# Patient Record
Sex: Female | Born: 1970 | Race: White | Hispanic: No | Smoking: Never smoker
Health system: Southern US, Community
[De-identification: ages and names within clinical notes are randomized; demographics above are authoritative.]

## PROBLEM LIST (undated history)

## (undated) DIAGNOSIS — J45909 Unspecified asthma, uncomplicated: Secondary | ICD-10-CM

## (undated) DIAGNOSIS — E119 Type 2 diabetes mellitus without complications: Secondary | ICD-10-CM

## (undated) DIAGNOSIS — N200 Calculus of kidney: Secondary | ICD-10-CM

## (undated) DIAGNOSIS — I10 Essential (primary) hypertension: Secondary | ICD-10-CM

## (undated) HISTORY — DX: Essential (primary) hypertension: I10

## (undated) HISTORY — DX: Type 2 diabetes mellitus without complications: E11.9

---

## 1992-04-16 HISTORY — PX: TUBAL LIGATION: SHX77

## 2004-04-13 ENCOUNTER — Ambulatory Visit (HOSPITAL_COMMUNITY): Admission: RE | Admit: 2004-04-13 | Discharge: 2004-04-13 | Payer: Self-pay | Admitting: Obstetrics & Gynecology

## 2004-07-25 ENCOUNTER — Ambulatory Visit: Payer: Self-pay | Admitting: Family Medicine

## 2004-10-25 ENCOUNTER — Ambulatory Visit: Payer: Self-pay | Admitting: Family Medicine

## 2004-12-14 ENCOUNTER — Encounter: Payer: Self-pay | Admitting: Obstetrics & Gynecology

## 2004-12-14 ENCOUNTER — Ambulatory Visit (HOSPITAL_COMMUNITY): Admission: RE | Admit: 2004-12-14 | Discharge: 2004-12-14 | Payer: Self-pay | Admitting: Obstetrics & Gynecology

## 2005-01-18 ENCOUNTER — Emergency Department (HOSPITAL_COMMUNITY): Admission: EM | Admit: 2005-01-18 | Discharge: 2005-01-18 | Payer: Self-pay | Admitting: Emergency Medicine

## 2005-02-09 ENCOUNTER — Emergency Department (HOSPITAL_COMMUNITY): Admission: EM | Admit: 2005-02-09 | Discharge: 2005-02-09 | Payer: Self-pay | Admitting: Emergency Medicine

## 2006-04-16 HISTORY — PX: ENDOMETRIAL ABLATION: SHX621

## 2007-01-08 ENCOUNTER — Emergency Department (HOSPITAL_COMMUNITY): Admission: EM | Admit: 2007-01-08 | Discharge: 2007-01-08 | Payer: Self-pay | Admitting: Emergency Medicine

## 2007-04-17 ENCOUNTER — Encounter: Payer: Self-pay | Admitting: Family Medicine

## 2007-04-29 ENCOUNTER — Emergency Department (HOSPITAL_COMMUNITY): Admission: EM | Admit: 2007-04-29 | Discharge: 2007-04-29 | Payer: Self-pay | Admitting: Emergency Medicine

## 2007-05-05 ENCOUNTER — Ambulatory Visit (HOSPITAL_COMMUNITY): Admission: RE | Admit: 2007-05-05 | Discharge: 2007-05-05 | Payer: Self-pay | Admitting: Family Medicine

## 2007-05-19 ENCOUNTER — Emergency Department (HOSPITAL_COMMUNITY): Admission: EM | Admit: 2007-05-19 | Discharge: 2007-05-19 | Payer: Self-pay | Admitting: Emergency Medicine

## 2007-08-03 ENCOUNTER — Emergency Department (HOSPITAL_COMMUNITY): Admission: EM | Admit: 2007-08-03 | Discharge: 2007-08-03 | Payer: Self-pay | Admitting: Emergency Medicine

## 2007-11-02 ENCOUNTER — Emergency Department (HOSPITAL_COMMUNITY): Admission: EM | Admit: 2007-11-02 | Discharge: 2007-11-02 | Payer: Self-pay | Admitting: Emergency Medicine

## 2009-09-25 ENCOUNTER — Emergency Department (HOSPITAL_COMMUNITY): Admission: EM | Admit: 2009-09-25 | Discharge: 2009-09-25 | Payer: Self-pay | Admitting: Emergency Medicine

## 2010-05-16 NOTE — Letter (Signed)
Summary: rpc chart  rpc chart   Imported By: Curtis Sites 01/06/2010 09:22:37  _____________________________________________________________________  External Attachment:    Type:   Image     Comment:   External Document

## 2010-07-03 LAB — URINALYSIS, ROUTINE W REFLEX MICROSCOPIC
Bilirubin Urine: NEGATIVE
Hgb urine dipstick: NEGATIVE
Protein, ur: NEGATIVE mg/dL
Urobilinogen, UA: 1 mg/dL (ref 0.0–1.0)

## 2010-09-01 NOTE — Op Note (Signed)
NAME:  Jaclyn Ruiz, FRAISER NO.:  [id number]   MEDICAL RECORD NO.:  [id number]          PATIENT TYPE:  AMB   LOCATION:  DAY                           FACILITY:  [facility]   PHYSICIAN:  Lazaro Arms, M.D.   DATE OF BIRTH:  02-Apr-1971   DATE OF PROCEDURE:  04/13/2004  DATE OF DISCHARGE:  04/13/2004                                 OPERATIVE REPORT   PREOPERATIVE DIAGNOSES:  1.  Menometrorrhagia.  2.  Dysmenorrhea.   POSTOPERATIVE DIAGNOSES:  1.  Menometrorrhagia.  2.  Dysmenorrhea.   PROCEDURE:  Hysteroscopy dilatation and curettage, endometrial ablation.   SURGEON:  Lazaro Arms, M.D.   ANESTHESIA:  General laryngeal mask airway.   FINDINGS:  The patient had normal endometrial cavity, had a small  endometrial polyp which was removed.  The rest of the curettings were  normal.   DESCRIPTION OF OPERATION:  The patient was taken to the operating room and  placed in the supine position, where she underwent laryngeal mask airway.  She was then placed in the dorsal lithotomy position and prepped and draped  in the usual sterile fashion.  A Graves speculum was placed, cervix was  grasped.  A paracervical block was placed.  The cervix was grasped serially  using Hanks dilators to allow passage of the hysteroscope.  The above-noted  findings were seen and curettings were performed, including removal of the  polyp.  Then the endometrial ablation was performed with a HydroThermAblator  unit, which went quite well.  There was a 10-minute heating cycle up to  basically 90 degrees Celsius.  There was no loss of fluid during the  procedure, and the procedure was visualized the entire time.  The patient  tolerated the procedure well.  She experienced minimal blood loss and was  taken to recovery in good, stable condition.  All counts correct x3.      LHE/MEDQ  D:  06/14/2004  T:  06/14/2004  Job:  [id number]

## 2010-09-01 NOTE — Op Note (Signed)
NAME:  Jaclyn Ruiz, Jaclyn Ruiz NO.:  [id number]   MEDICAL RECORD NO.:  [id number]          PATIENT TYPE:  AMB   LOCATION:  DAY                           FACILITY:  [facility]   PHYSICIAN:  Lazaro Arms, M.D.   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   POSTOPERATIVE DIAGNOSES:  1.  Persistent dysfunctional uterine bleeding after HydroThermAblator last      December.  2.  Endometrial polyp.   PROCEDURE:  Hysteroscopy D&C with removal of endometrial polyp and a repeat  endometrial ablation using ThermaChoice III balloon.   SURGEON:  Dr. Despina Hidden.   ANESTHESIA:  General endotracheal.   FINDINGS:  Even though on ultrasound in the office there was no polyp seen  but just a fluid-filled endometrial cavity, at the time of diagnostic  hysteroscopy, she had an endometrial polyp which was adherent, attached to  the pedicle on the posterior wall. It was relatively flat. I think it is  probably hollow because it came out very easily with curettage. The rest of  the endometrium was normal.   DESCRIPTION OF PROCEDURE:  The patient was taken to the operating room,  placed in a supine position where she underwent general endotracheal  anesthesia, placed in dorsal lithotomy position, prepped and draped in the  usual sterile fashion. Grave's speculum was placed. Cervix was grasped. Half  percent Marcaine with 1:200,000 epinephrine was injected as a local  anesthetic without difficulty. A total of 20 cc was used. The cervix was  dilated serially to allow passage of the hysteroscope. Hysteroscopy was  placed, and the above noted findings were seen. A vigorous uterine curettage  was performed, and Randal stone forceps were used to retrieve the polyp.  After the curettage and polyp removal, the  endometrium was completely clear.  A ThermaChoice III endometrial ablation was then performed, 22 cc of D5W was  used to maintain a pressure between 190 and 200 mmHg. It was heated to 87  degrees Celsius for a total therapy time of 9 minutes and 58 seconds. All  the fluid was retrieved at the end of the procedure. The patient tolerated  the procedure well. She experienced minimal blood loss and was taken to  recovery room in good and stable condition. She received Ancef and Toradol  prophylactically.      Lazaro Arms, M.D.  Electronically Signed     LHE/MEDQ  D:  12/14/2004  T:  12/14/2004  Job:  [id number]

## 2012-04-28 ENCOUNTER — Encounter (HOSPITAL_COMMUNITY): Payer: Self-pay | Admitting: *Deleted

## 2012-04-28 ENCOUNTER — Emergency Department (HOSPITAL_COMMUNITY)
Admission: EM | Admit: 2012-04-28 | Discharge: 2012-04-28 | Disposition: A | Discharge status: Home / Self Care | Payer: Self-pay | Attending: Emergency Medicine | Admitting: Emergency Medicine

## 2012-04-28 DIAGNOSIS — J40 Bronchitis, not specified as acute or chronic: Secondary | ICD-10-CM | POA: Insufficient documentation

## 2012-04-28 DIAGNOSIS — J45909 Unspecified asthma, uncomplicated: Secondary | ICD-10-CM | POA: Insufficient documentation

## 2012-04-28 HISTORY — DX: Unspecified asthma, uncomplicated: J45.909

## 2012-04-28 MED ORDER — AMOXICILLIN 500 MG PO CAPS
500.0000 mg | ORAL_CAPSULE | Freq: Once | ORAL | Status: AC
  Administered 2012-04-28: 500 mg via ORAL
  Filled 2012-04-28: qty 1

## 2012-04-28 MED ORDER — AMOXICILLIN 500 MG PO CAPS: 500.0000 mg | ORAL_CAPSULE | Freq: Three times a day (TID) | ORAL | Status: DC

## 2012-04-28 MED ORDER — HYDROCODONE-ACETAMINOPHEN 5-325 MG PO TABS
2.0000 | ORAL_TABLET | Freq: Once | ORAL | Status: AC
  Administered 2012-04-28: 2 via ORAL
  Filled 2012-04-28: qty 2

## 2012-04-28 MED ORDER — HYDROCODONE-ACETAMINOPHEN 5-325 MG PO TABS: 2.0000 | ORAL_TABLET | ORAL | Status: DC | PRN

## 2012-04-28 NOTE — ED Provider Notes (Addendum)
History   This chart was scribed for Doug Sou, MD by Gerlean Ren, ED Scribe. This patient was seen in room TR05C/TR05C and the patient's care was started at 5:31 PM    CSN: [id number]  Arrival date & time 04/28/12  1437   First MD Initiated Contact with Patient 04/28/12 1729      Chief Complaint  Patient presents with  . Otalgia    The history is provided by the patient. No language interpreter was used.   Jaclyn Ruiz is a 42 y.o. female with h/o asthma who presents to the Emergency Department complaining of 2 weeks of constant, non-changing, moderate left side otalgia with associated constant cough producing green phlegm.  Pt denies fever, sore throat.  No OCM used for pain.  No known allergies.  Pt denies tobacco and alcohol use. Patient reports her last asthma attack was age 26 Past Medical History  Diagnosis Date  . Asthma     Past Surgical History  Procedure Date  . Cesarean section     times 2    No family history on file.  History  Substance Use Topics  . Smoking status: Never Smoker   . Smokeless tobacco: Not on file  . Alcohol Use: No    No OB history provided.   Review of Systems  Constitutional: Negative.   HENT: Negative.   Respiratory: Negative.   Cardiovascular: Negative.   Gastrointestinal: Negative.   Musculoskeletal: Negative.   Skin: Negative.   Neurological: Negative.   Hematological: Negative.   Psychiatric/Behavioral: Negative.     Allergies  Review of patient's allergies indicates no known allergies.  Home Medications  No current outpatient prescriptions on file.  BP 138/78  Pulse 86  Temp 98.8 F (37.1 C) (Oral)  Resp 18  SpO2 95%  Physical Exam  Nursing note and vitals reviewed. Constitutional: She appears well-developed and well-nourished.  HENT:  Head: Normocephalic and atraumatic.  Right Ear: External ear normal.  Left Ear: External ear normal.       Red pharynx. Bilateral tympanic membranes normal uvula midline  no tonsillar exudate tonsils not large  Eyes: Conjunctivae normal are normal. Pupils are equal, round, and reactive to light.  Neck: Neck supple. No tracheal deviation present. No thyromegaly present.  Cardiovascular: Normal rate and regular rhythm.   No murmur heard. Pulmonary/Chest: Effort normal and breath sounds normal.       Coughing  Abdominal: Soft. Bowel sounds are normal. She exhibits no distension. There is no tenderness.  Musculoskeletal: Normal range of motion. She exhibits no edema and no tenderness.  Lymphadenopathy:    She has no cervical adenopathy.  Neurological: She is alert. Coordination normal.  Skin: Skin is warm and dry. No rash noted.  Psychiatric: She has a normal mood and affect.   Oral ED Course  Procedures (including critical care time) DIAGNOSTIC STUDIES: Oxygen Saturation is 95% on room air, adequate by my interpretation.    COORDINATION OF CARE: 5:33 PM- Patient informed of clinical course, understands medical decision-making process, and agrees with plan.  No diagnosis found.    MDM  Suspect viral illness however patient has been with symptoms for 2.5 weeks therefore prescribed antibiotic, Amoxil. Also Norco as needed for pain. Referral resource guide Diagnosis #1 bronchitis  #2 otalgia      I personally performed the services described in this documentation, which was scribed in my presence. The recorded information has been reviewed and is accurate.  I personally performed the services described  in this documentation, which was scribed in my presence. The recorded information has been reviewed and is accurate.   Doug Sou, MD 04/28/12 1739  Doug Sou, MD 04/29/12 4098

## 2012-04-28 NOTE — ED Notes (Signed)
Left earache onset last Weds, no hx of ear infection/pain. A&Ox4, ambulatory, nad.

## 2012-04-28 NOTE — ED Notes (Signed)
Pt ambulatory leaving ED by self. Pt has been instructed not to drive. Pt states husband is driving her home. Pt given d/c teaching and prescriptions. Pt verbalized understanding of instructions and has no further questions upon d/c. Pt does not appear to be in acute distress upon d/c.

## 2012-04-28 NOTE — ED Notes (Signed)
Pt is here with left ear ache and persistent cough for 2 weeks.  No fever

## 2014-06-26 ENCOUNTER — Emergency Department (HOSPITAL_COMMUNITY)
Admission: EM | Admit: 2014-06-26 | Discharge: 2014-06-26 | Disposition: A | Discharge status: Home / Self Care | Payer: Self-pay | Attending: Emergency Medicine | Admitting: Emergency Medicine

## 2014-06-26 ENCOUNTER — Emergency Department (HOSPITAL_COMMUNITY): Payer: Self-pay

## 2014-06-26 ENCOUNTER — Encounter (HOSPITAL_COMMUNITY): Payer: Self-pay | Admitting: *Deleted

## 2014-06-26 DIAGNOSIS — J45909 Unspecified asthma, uncomplicated: Secondary | ICD-10-CM | POA: Insufficient documentation

## 2014-06-26 DIAGNOSIS — R1032 Left lower quadrant pain: Secondary | ICD-10-CM

## 2014-06-26 DIAGNOSIS — N201 Calculus of ureter: Secondary | ICD-10-CM | POA: Insufficient documentation

## 2014-06-26 DIAGNOSIS — Z3202 Encounter for pregnancy test, result negative: Secondary | ICD-10-CM | POA: Insufficient documentation

## 2014-06-26 DIAGNOSIS — R109 Unspecified abdominal pain: Secondary | ICD-10-CM

## 2014-06-26 LAB — CBC
HCT: 39.5 % (ref 36.0–46.0)
Hemoglobin: 12.8 g/dL (ref 12.0–15.0)
MCH: 27.6 pg (ref 26.0–34.0)
MCHC: 32.4 g/dL (ref 30.0–36.0)
MCV: 85.1 fL (ref 78.0–100.0)
PLATELETS: 311 10*3/uL (ref 150–400)
RBC: 4.64 MIL/uL (ref 3.87–5.11)
RDW: 14.1 % (ref 11.5–15.5)
WBC: 13.4 10*3/uL — AB (ref 4.0–10.5)

## 2014-06-26 LAB — COMPREHENSIVE METABOLIC PANEL
ALBUMIN: 3.3 g/dL — AB (ref 3.5–5.2)
ALK PHOS: 56 U/L (ref 39–117)
ALT: 18 U/L (ref 0–35)
AST: 15 U/L (ref 0–37)
Anion gap: 7 (ref 5–15)
BILIRUBIN TOTAL: 0.5 mg/dL (ref 0.3–1.2)
BUN: 18 mg/dL (ref 6–23)
CALCIUM: 9.3 mg/dL (ref 8.4–10.5)
CO2: 28 mmol/L (ref 19–32)
Chloride: 103 mmol/L (ref 96–112)
Creatinine, Ser: 0.86 mg/dL (ref 0.50–1.10)
GFR calc Af Amer: >90 mL/min (ref >90)
GFR, EST NON AFRICAN AMERICAN: 80 mL/min — AB (ref >90)
Glucose, Bld: 108 mg/dL — ABNORMAL HIGH (ref 70–99)
Potassium: 3.5 mmol/L (ref 3.5–5.1)
Sodium: 138 mmol/L (ref 135–145)
TOTAL PROTEIN: 7.7 g/dL (ref 6.0–8.3)

## 2014-06-26 LAB — URINALYSIS, ROUTINE W REFLEX MICROSCOPIC
BILIRUBIN URINE: NEGATIVE
Glucose, UA: NEGATIVE mg/dL
KETONES UR: NEGATIVE mg/dL
Leukocytes, UA: NEGATIVE
NITRITE: NEGATIVE
PH: 6 (ref 5.0–8.0)
Protein, ur: NEGATIVE mg/dL
SPECIFIC GRAVITY, URINE: 1.02 (ref 1.005–1.030)
UROBILINOGEN UA: 0.2 mg/dL (ref 0.0–1.0)

## 2014-06-26 LAB — URINE MICROSCOPIC-ADD ON

## 2014-06-26 LAB — PREGNANCY, URINE: Preg Test, Ur: NEGATIVE

## 2014-06-26 MED ORDER — TAMSULOSIN HCL 0.4 MG PO CAPS: ORAL_CAPSULE | ORAL | Status: DC

## 2014-06-26 MED ORDER — SODIUM CHLORIDE 0.9 % IV SOLN
INTRAVENOUS | Status: DC
  Administered 2014-06-26: 04:00:00 via INTRAVENOUS

## 2014-06-26 MED ORDER — FENTANYL CITRATE 0.05 MG/ML IJ SOLN
50.0000 ug | Freq: Once | INTRAMUSCULAR | Status: AC
  Administered 2014-06-26: 50 ug via INTRAVENOUS
  Filled 2014-06-26: qty 2

## 2014-06-26 MED ORDER — ONDANSETRON HCL 4 MG/2ML IJ SOLN
4.0000 mg | Freq: Once | INTRAMUSCULAR | Status: AC
  Administered 2014-06-26: 4 mg via INTRAVENOUS
  Filled 2014-06-26: qty 2

## 2014-06-26 MED ORDER — OXYCODONE-ACETAMINOPHEN 5-325 MG PO TABS: 1.0000 | ORAL_TABLET | Freq: Four times a day (QID) | ORAL | Status: DC | PRN

## 2014-06-26 MED ORDER — ONDANSETRON 4 MG PO TBDP: 4.0000 mg | ORAL_TABLET | Freq: Three times a day (TID) | ORAL | Status: DC | PRN

## 2014-06-26 NOTE — ED Notes (Signed)
Pt c/o left sided flank pain and back x 1 wk. Pt states she is also nauseated. Pt states the pain and vomiting eased up last week and now the pain is back.

## 2014-06-26 NOTE — ED Provider Notes (Signed)
CSN: [id number]     Arrival date & time 06/26/14  0009 History   First MD Initiated Contact with Patient 06/26/14 0249     Chief Complaint  Patient presents with  . Flank Pain     (Consider location/radiation/quality/duration/timing/severity/associated sxs/prior Treatment) HPI  Patient relates last week she started having left flank pain that radiated into her left stomach that lasted all day and all night. It then went away however it did return a few days ago with some left lower quadrant pain. She reports tonight she had acute worsening of the pain with pain in her left flank radiating into her left lower quadrant. She denies nausea, vomiting, hematuria, pain on urination although she has had frequency and urge to urinate. She denies any fever. She states nothing she does makes the pain hurt more, nothing she does makes her feel better. She states she's never had this pain before. She does report drinking a lot of sodas but mainly Sprite which are caffeine free.  Family history her daughter has had kidney stones  PCP none  Past Medical History  Diagnosis Date  . Asthma    Past Surgical History  Procedure Laterality Date  . Cesarean section      times 2   History reviewed. No pertinent family history. History  Substance Use Topics  . Smoking status: Never Smoker   . Smokeless tobacco: Not on file  . Alcohol Use: No   employed  OB History    No data available     Review of Systems  All other systems reviewed and are negative.     Allergies  Review of patient's allergies indicates no known allergies.  Home Medications   Prior to Admission medications   Medication Sig Start Date End Date Taking? Authorizing Provider  ondansetron (ZOFRAN ODT) 4 MG disintegrating tablet Take 1 tablet (4 mg total) by mouth every 8 (eight) hours as needed for nausea or vomiting. 06/26/14   Devoria AlbeIva Jazzelle Zhang, MD  oxyCODONE-acetaminophen (PERCOCET/ROXICET) 5-325 MG per tablet Take 1-2 tablets by  mouth every 6 (six) hours as needed for severe pain. 06/26/14   Devoria AlbeIva Liora Myles, MD  tamsulosin (FLOMAX) 0.4 MG CAPS capsule Take 1 po QD until you pass the stone. 06/26/14   Devoria AlbeIva Blayden Conwell, MD   BP 118/74 mmHg  Pulse 74  Temp(Src) 97.8 F (36.6 C) (Oral)  Resp 18  Ht 5' 2.5" (1.588 m)  Wt 283 lb (128.368 kg)  BMI 50.90 kg/m2  SpO2 99%  Vital signs normal   Physical Exam  Constitutional: She is oriented to person, place, and time. She appears well-developed and well-nourished.  Non-toxic appearance. She does not appear ill. No distress.  HENT:  Head: Normocephalic and atraumatic.  Right Ear: External ear normal.  Left Ear: External ear normal.  Nose: Nose normal. No mucosal edema or rhinorrhea.  Mouth/Throat: Oropharynx is clear and moist and mucous membranes are normal. No dental abscesses or uvula swelling.  Eyes: Conjunctivae and EOM are normal. Pupils are equal, round, and reactive to light.  Neck: Normal range of motion and full passive range of motion without pain. Neck supple.  Cardiovascular: Normal rate, regular rhythm and normal heart sounds.  Exam reveals no gallop and no friction rub.   No murmur heard. Pulmonary/Chest: Effort normal and breath sounds normal. No respiratory distress. She has no wheezes. She has no rhonchi. She has no rales. She exhibits no tenderness and no crepitus.  Abdominal: Soft. Normal appearance and bowel sounds are normal.  She exhibits no distension. There is tenderness. There is no rebound and no guarding.    Patient has some mild left flank tenderness  Musculoskeletal: Normal range of motion. She exhibits no edema or tenderness.  Moves all extremities well.   Neurological: She is alert and oriented to person, place, and time. She has normal strength. No cranial nerve deficit.  Skin: Skin is warm, dry and intact. No rash noted. No erythema. No pallor.  Psychiatric: She has a normal mood and affect. Her speech is normal and behavior is normal. Her mood  appears not anxious.  Nursing note and vitals reviewed.   ED Course  Procedures (including critical care time)  Medications  0.9 %  sodium chloride infusion ( Intravenous New Bag/Given 06/26/14 0402)  fentaNYL (SUBLIMAZE) injection 50 mcg (50 mcg Intravenous Given 06/26/14 0403)  ondansetron (ZOFRAN) injection 4 mg (4 mg Intravenous Given 06/26/14 0402)   Patient was rechecked couple of times. She remained pain-free after getting that pain medication in the ED. We discussed her CT results. We discussed of her pain gets worse that she might need a stent and possible lithotripsy. She understands she needs to follow-up with urology and if she gets worse over the weekend to go to [facility] emergency department.   Labs Review Results for orders placed or performed during the hospital encounter of 06/26/14  Pregnancy, urine  Result Value Ref Range   Preg Test, Ur NEGATIVE NEGATIVE  Urinalysis, Routine w reflex microscopic  Result Value Ref Range   Color, Urine YELLOW YELLOW   APPearance HAZY (A) CLEAR   Specific Gravity, Urine 1.020 1.005 - 1.030   pH 6.0 5.0 - 8.0   Glucose, UA NEGATIVE NEGATIVE mg/dL   Hgb urine dipstick LARGE (A) NEGATIVE   Bilirubin Urine NEGATIVE NEGATIVE   Ketones, ur NEGATIVE NEGATIVE mg/dL   Protein, ur NEGATIVE NEGATIVE mg/dL   Urobilinogen, UA 0.2 0.0 - 1.0 mg/dL   Nitrite NEGATIVE NEGATIVE   Leukocytes, UA NEGATIVE NEGATIVE  Urine microscopic-add on  Result Value Ref Range   Squamous Epithelial / LPF FEW (A) RARE   WBC, UA 0-2 <3 WBC/hpf   RBC / HPF TOO NUMEROUS TO COUNT <3 RBC/hpf   Bacteria, UA FEW (A) RARE  CBC  Result Value Ref Range   WBC 13.4 (H) 4.0 - 10.5 K/uL   RBC 4.64 3.87 - 5.11 MIL/uL   Hemoglobin 12.8 12.0 - 15.0 g/dL   HCT 40.9 81.1 - 91.4 %   MCV 85.1 78.0 - 100.0 fL   MCH 27.6 26.0 - 34.0 pg   MCHC 32.4 30.0 - 36.0 g/dL   RDW 78.2 95.6 - 21.3 %   Platelets 311 150 - 400 K/uL  Comprehensive metabolic panel  Result Value Ref  Range   Sodium 138 135 - 145 mmol/L   Potassium 3.5 3.5 - 5.1 mmol/L   Chloride 103 96 - 112 mmol/L   CO2 28 19 - 32 mmol/L   Glucose, Bld 108 (H) 70 - 99 mg/dL   BUN 18 6 - 23 mg/dL   Creatinine, Ser 0.86 0.50 - 1.10 mg/dL   Calcium 9.3 8.4 - 57.8 mg/dL   Total Protein 7.7 6.0 - 8.3 g/dL   Albumin 3.3 (L) 3.5 - 5.2 g/dL   AST 15 0 - 37 U/L   ALT 18 0 - 35 U/L   Alkaline Phosphatase 56 39 - 117 U/L   Total Bilirubin 0.5 0.3 - 1.2 mg/dL   GFR calc non  Af Amer 80 (L) >90 mL/min   GFR calc Af Amer >90 >90 mL/min   Anion gap 7 5 - 15   Laboratory interpretation all normal except hematuria     Imaging Review Ct Renal Stone Study  06/26/2014   CLINICAL DATA:  Acute left lower quadrant and left flank pain. Nausea and vomiting  EXAM: CT ABDOMEN AND PELVIS WITHOUT CONTRAST  TECHNIQUE: Multidetector CT imaging of the abdomen and pelvis was performed following the standard protocol without IV contrast.  COMPARISON:  04/29/2007  FINDINGS: The included lung bases are clear.  There is a 6 mm obstructing stone in the left proximal ureter with resultant hydronephrosis. Mild left perinephric stranding. Ureter distal to this is decompressed. Question additional punctate nonobstructing left renal calculi. No stones or obstructive uropathy in the right kidney.  Mild hepatic steatosis. Gallbladder is decompressed. The unenhanced spleen, pancreas, and adrenal gland are normal. Stomach is physiologically distended with ingested contents. There are no dilated or thickened bowel loops. Small volume of stool throughout the colon. The appendix is normal. No free air, free fluid, or intra-abdominal fluid collection.  Abdominal aorta is normal in caliber. No retroperitoneal adenopathy.  In the pelvis the bladder is minimally distended. The uterus and adnexa are normal. No pelvic free fluid.  Facet arthropathy in the lower lumbar spine. Punctate bone islands in the pelvis.  IMPRESSION: 1. Obstructing 6 mm stone in the  left proximal ureter with resultant hydronephrosis. Probable additional punctate nonobstructing left renal calculi. 2. Hepatic steatosis.   Electronically Signed   By: Rubye Oaks M.D.   On: 06/26/2014 05:30     EKG Interpretation None      MDM   Final diagnoses:  Acute left flank pain  LLQ abdominal pain  Left ureteral stone    New Prescriptions   ONDANSETRON (ZOFRAN ODT) 4 MG DISINTEGRATING TABLET    Take 1 tablet (4 mg total) by mouth every 8 (eight) hours as needed for nausea or vomiting.   OXYCODONE-ACETAMINOPHEN (PERCOCET/ROXICET) 5-325 MG PER TABLET    Take 1-2 tablets by mouth every 6 (six) hours as needed for severe pain.   TAMSULOSIN (FLOMAX) 0.4 MG CAPS CAPSULE    Take 1 po QD until you pass the stone.    Plan discharge  Devoria Albe, MD, Concha Pyo, MD 06/26/14 [phone]

## 2014-06-26 NOTE — Discharge Instructions (Signed)
Drink plenty of fluids. Take the medications as prescribed. If he should get fever, or have uncontrolled vomiting or worsening [facility]rolog[facility]. Otherwise call the office on Monday to get a follow-up appointment this week. Some of his partners to come to [place] so you could see one of them in the [place] office.   Urine Strainer This strainer is used to catch or filter out any stones found in your urine. Place the strainer under your urine stream. Save any stones or objects that you find in your urine. Place them in a plastic or glass container to show your caregiver. The stones vary in size - some can be very small, so make sure you check the strainer carefully. Your caregiver may send the stone to the lab. When the results are back, your caregiver may recommend medicines or diet changes.  Document Released: 01/06/2004 Document Revised: 06/25/2011 Document Reviewed: 02/13/2008 [facility] Patient Information 2015 [place]. This information is not intended to replace advice given to you by your health care provider. Make sure you discuss any questions you have with your health care provider.  Ureteral Colic (Kidney Stones) Ureteral colic is the result of a condition when kidney stones form inside the kidney. Once kidney stones are formed they may move into the tube that connects the kidney with the bladder (ureter). If this occurs, this condition may cause pain (colic) in the ureter.  CAUSES  Pain is caused by stone movement in the ureter and the obstruction caused by the stone. SYMPTOMS  The pain comes and goes as the ureter contracts around the stone. The pain is usually intense, sharp, and stabbing in character. The location of the pain may move as the stone moves through the ureter. When the stone is near the kidney the pain is usually located in the back and radiates to the belly (abdomen). When the stone  is ready to pass into the bladder the pain is often located in the lower abdomen on the side the stone is located. At this location, the symptoms may mimic those of a urinary tract infection with urinary frequency. Once the stone is located here it often passes into the bladder and the pain disappears completely. TREATMENT   Your caregiver will provide you with medicine for pain relief.  You may require specialized follow-up X-rays.  The absence of pain does not always mean that the stone has passed. It may have just stopped moving. If the urine remains completely obstructed, it can cause loss of kidney function or even complete destruction of the involved kidney. It is your responsibility and in your interest that X-rays and follow-ups as suggested by your caregiver are completed. Relief of pain without passage of the stone can be associated with severe damage to the kidney, including loss of kidney function on that side.  If your stone does not pass on its own, additional measures may be taken by your caregiver to ensure its removal. HOME CARE INSTRUCTIONS   Increase your fluid intake. Water is the preferred fluid since juices containing vitamin C may acidify the urine making it less likely for certain stones (uric acid stones) to pass.  Strain all urine. A strainer will be provided. Keep all particulate matter or stones for your caregiver to inspect.  Take your pain medicine as directed.  Make a follow-up appointment with your caregiver as directed.  Remember that the goal is passage of  your stone. The absence of pain does not mean the stone is gone. Follow your caregiver's instructions.  Only take over-the-counter or prescription medicines for pain, discomfort, or fever as directed by your caregiver. SEEK MEDICAL CARE IF:   Pain cannot be controlled with the prescribed medicine.  You have a fever.  Pain continues for longer than your caregiver advises it should.  There is a change  in the pain, and you develop chest discomfort or constant abdominal pain.  You feel faint or pass out. MAKE SURE YOU:   Understand these instructions.  Will watch your condition.  Will get help right away if you are not doing well or get worse. Document Released: 01/10/2005 Document Revised: 07/28/2012 Document Reviewed: 09/27/2010 [facility] Patient Information 2015 [place], [place]. This information is not intended to replace advice given to you by your health care provider. Make sure you discuss any questions you have with your health care provider.

## 2014-07-08 ENCOUNTER — Encounter (HOSPITAL_COMMUNITY): Payer: Self-pay | Admitting: Emergency Medicine

## 2014-07-08 ENCOUNTER — Emergency Department (HOSPITAL_COMMUNITY)
Admission: EM | Admit: 2014-07-08 | Discharge: 2014-07-08 | Disposition: A | Discharge status: Home / Self Care | Payer: Self-pay | Attending: Emergency Medicine | Admitting: Emergency Medicine

## 2014-07-08 DIAGNOSIS — N200 Calculus of kidney: Secondary | ICD-10-CM | POA: Insufficient documentation

## 2014-07-08 DIAGNOSIS — J45909 Unspecified asthma, uncomplicated: Secondary | ICD-10-CM | POA: Insufficient documentation

## 2014-07-08 DIAGNOSIS — Z9889 Other specified postprocedural states: Secondary | ICD-10-CM | POA: Insufficient documentation

## 2014-07-08 DIAGNOSIS — Z3202 Encounter for pregnancy test, result negative: Secondary | ICD-10-CM | POA: Insufficient documentation

## 2014-07-08 DIAGNOSIS — E669 Obesity, unspecified: Secondary | ICD-10-CM | POA: Insufficient documentation

## 2014-07-08 LAB — URINE MICROSCOPIC-ADD ON

## 2014-07-08 LAB — URINALYSIS, ROUTINE W REFLEX MICROSCOPIC
Bilirubin Urine: NEGATIVE
Glucose, UA: NEGATIVE mg/dL
KETONES UR: NEGATIVE mg/dL
Leukocytes, UA: NEGATIVE
NITRITE: NEGATIVE
PH: 5.5 (ref 5.0–8.0)
Protein, ur: NEGATIVE mg/dL
SPECIFIC GRAVITY, URINE: 1.023 (ref 1.005–1.030)
Urobilinogen, UA: 0.2 mg/dL (ref 0.0–1.0)

## 2014-07-08 LAB — LIPASE, BLOOD: Lipase: 28 U/L (ref 11–59)

## 2014-07-08 LAB — CBC WITH DIFFERENTIAL/PLATELET
BASOS ABS: 0.1 10*3/uL (ref 0.0–0.1)
Basophils Relative: 1 % (ref 0–1)
EOS PCT: 6 % — AB (ref 0–5)
Eosinophils Absolute: 0.8 10*3/uL — ABNORMAL HIGH (ref 0.0–0.7)
HEMATOCRIT: 41.2 % (ref 36.0–46.0)
Hemoglobin: 13.2 g/dL (ref 12.0–15.0)
LYMPHS ABS: 2.8 10*3/uL (ref 0.7–4.0)
LYMPHS PCT: 23 % (ref 12–46)
MCH: 27.4 pg (ref 26.0–34.0)
MCHC: 32 g/dL (ref 30.0–36.0)
MCV: 85.7 fL (ref 78.0–100.0)
MONO ABS: 1.1 10*3/uL — AB (ref 0.1–1.0)
Monocytes Relative: 9 % (ref 3–12)
NEUTROS ABS: 7.3 10*3/uL (ref 1.7–7.7)
Neutrophils Relative %: 61 % (ref 43–77)
Platelets: 337 10*3/uL (ref 150–400)
RBC: 4.81 MIL/uL (ref 3.87–5.11)
RDW: 14 % (ref 11.5–15.5)
WBC: 12.1 10*3/uL — AB (ref 4.0–10.5)

## 2014-07-08 LAB — COMPREHENSIVE METABOLIC PANEL
ALBUMIN: 3.5 g/dL (ref 3.5–5.2)
ALT: 23 U/L (ref 0–35)
AST: 24 U/L (ref 0–37)
Alkaline Phosphatase: 60 U/L (ref 39–117)
Anion gap: 9 (ref 5–15)
BUN: 15 mg/dL (ref 6–23)
CALCIUM: 9.1 mg/dL (ref 8.4–10.5)
CO2: 26 mmol/L (ref 19–32)
Chloride: 102 mmol/L (ref 96–112)
Creatinine, Ser: 1.08 mg/dL (ref 0.50–1.10)
GFR calc Af Amer: 71 mL/min — ABNORMAL LOW (ref >90)
GFR, EST NON AFRICAN AMERICAN: 61 mL/min — AB (ref >90)
Glucose, Bld: 124 mg/dL — ABNORMAL HIGH (ref 70–99)
Potassium: 4.4 mmol/L (ref 3.5–5.1)
SODIUM: 137 mmol/L (ref 135–145)
TOTAL PROTEIN: 8.1 g/dL (ref 6.0–8.3)
Total Bilirubin: 0.7 mg/dL (ref 0.3–1.2)

## 2014-07-08 LAB — PREGNANCY, URINE: PREG TEST UR: NEGATIVE

## 2014-07-08 MED ORDER — KETOROLAC TROMETHAMINE 30 MG/ML IJ SOLN
30.0000 mg | Freq: Once | INTRAMUSCULAR | Status: AC
  Administered 2014-07-08: 30 mg via INTRAVENOUS
  Filled 2014-07-08: qty 1

## 2014-07-08 MED ORDER — ONDANSETRON HCL 4 MG/2ML IJ SOLN
4.0000 mg | Freq: Once | INTRAMUSCULAR | Status: AC
  Administered 2014-07-08: 4 mg via INTRAVENOUS
  Filled 2014-07-08: qty 2

## 2014-07-08 MED ORDER — HYDROMORPHONE HCL 1 MG/ML IJ SOLN
1.0000 mg | Freq: Once | INTRAMUSCULAR | Status: AC
  Administered 2014-07-08: 1 mg via INTRAVENOUS
  Filled 2014-07-08: qty 1

## 2014-07-08 MED ORDER — MORPHINE SULFATE 4 MG/ML IJ SOLN
4.0000 mg | Freq: Once | INTRAMUSCULAR | Status: AC
  Administered 2014-07-08: 4 mg via INTRAVENOUS
  Filled 2014-07-08: qty 1

## 2014-07-08 MED ORDER — HYDROCODONE-ACETAMINOPHEN 5-325 MG PO TABS: 1.0000 | ORAL_TABLET | ORAL | Status: DC | PRN

## 2014-07-08 NOTE — ED Provider Notes (Signed)
CSN: [id number]     Arrival date & time 07/08/14  1026 History   First MD Initiated Contact with Patient 07/08/14 1219     Chief Complaint  Patient presents with  . Abdominal Pain  . Back Pain   Jaclyn Ruiz is a 44 y.o. female with a history of kidney stones who presents to the emergency department complaining of left flank pain beginning around 3 hours prior to arrival. Patient rates her pain at 9 out of 10 and describes it as sharp and constant. Patient's pain is worse standing but she is unable to identify any other alleviating or aggravating factors. Patient was diagnosed with kidney stone on 06/25/2014 Jaclyn Ruiz and reports her pain resolved after being in the emergency department. She reports today that her pain has returned and this feels the same as her previous kidney stone.  The patient is not following up with a urologist. Patient reports taking  Hydrocodone earlier today with minimal relief. The patient denies fevers, chills, dysuria, difficulty urinating, hematuria, urinary frequency, nausea, vomiting, diarrhea, constipation, vaginal bleeding or discharge.   (Consider location/radiation/quality/duration/timing/severity/associated sxs/prior Treatment) HPI  Past Medical History  Diagnosis Date  . Asthma    Past Surgical History  Procedure Laterality Date  . Cesarean section      times 2   No family history on file. History  Substance Use Topics  . Smoking status: Never Smoker   . Smokeless tobacco: Not on file  . Alcohol Use: No   OB History    No data available     Review of Systems  Constitutional: Negative for fever and chills.  HENT: Negative for congestion, ear pain and sore throat.   Eyes: Negative for pain and visual disturbance.  Respiratory: Negative for cough, shortness of breath and wheezing.   Cardiovascular: Negative for chest pain and palpitations.  Gastrointestinal: Positive for abdominal pain. Negative for nausea, vomiting and diarrhea.   Genitourinary: Positive for flank pain. Negative for dysuria, frequency, hematuria, vaginal bleeding, vaginal discharge, difficulty urinating and menstrual problem.  Musculoskeletal: Negative for back pain and neck pain.  Skin: Negative for rash.  Neurological: Negative for light-headedness and headaches.      Allergies  Review of patient's allergies indicates no known allergies.  Home Medications   Prior to Admission medications   Medication Sig Start Date End Date Taking? Authorizing Provider  oxyCODONE-acetaminophen (PERCOCET/ROXICET) 5-325 MG per tablet Take 1-2 tablets by mouth every 6 (six) hours as needed for severe pain. 06/26/14  Yes Jaclyn Albe, MD  HYDROcodone-acetaminophen (NORCO/VICODIN) 5-325 MG per tablet Take 1-2 tablets by mouth every 4 (four) hours as needed. 07/08/14   Jaclyn Farrier, PA-C  ondansetron (ZOFRAN ODT) 4 MG disintegrating tablet Take 1 tablet (4 mg total) by mouth every 8 (eight) hours as needed for nausea or vomiting. 06/26/14   Jaclyn Albe, MD  tamsulosin (FLOMAX) 0.4 MG CAPS capsule Take 1 po QD until you pass the stone. 06/26/14   Jaclyn Albe, MD   BP 161/94 mmHg  Pulse 62  Temp(Src) 97.8 F (36.6 C) (Oral)  Resp 16  SpO2 99% Physical Exam  Constitutional: She appears well-developed and well-nourished. No distress.   Obese female. Nontoxic appearing.  HENT:  Head: Normocephalic and atraumatic.  Mouth/Throat: Oropharynx is clear and moist. No oropharyngeal exudate.  Eyes: Conjunctivae are normal. Pupils are equal, round, and reactive to light. Right eye exhibits no discharge. Left eye exhibits no discharge.  Neck: Neck supple.  Cardiovascular: Normal rate, regular rhythm,  normal heart sounds and intact distal pulses.  Exam reveals no gallop and no friction rub.   No murmur heard. Pulmonary/Chest: Effort normal and breath sounds normal. No respiratory distress. She has no wheezes. She has no rales.  Abdominal: Soft. Bowel sounds are normal. She exhibits  no distension and no mass. There is tenderness. There is no rebound and no guarding.  Mild left lower quadrant tenderness to palpation. Abdomen is soft and bowel sounds are present. No RLQ tenderness.   Musculoskeletal: She exhibits no edema.  Lymphadenopathy:    She has no cervical adenopathy.  Neurological: She is alert. Coordination normal.  Skin: Skin is warm and dry. No rash noted. She is not diaphoretic. No erythema. No pallor.  Psychiatric: She has a normal mood and affect. Her behavior is normal.  Nursing note and vitals reviewed.   ED Course  Procedures (including critical care time) Labs Review Labs Reviewed  CBC WITH DIFFERENTIAL/PLATELET - Abnormal; Notable for the following:    WBC 12.1 (*)    Monocytes Absolute 1.1 (*)    Eosinophils Relative 6 (*)    Eosinophils Absolute 0.8 (*)    All other components within normal limits  COMPREHENSIVE METABOLIC PANEL - Abnormal; Notable for the following:    Glucose, Bld 124 (*)    GFR calc non Af Amer 61 (*)    GFR calc Af Amer 71 (*)    All other components within normal limits  URINALYSIS, ROUTINE W REFLEX MICROSCOPIC - Abnormal; Notable for the following:    APPearance CLOUDY (*)    Hgb urine dipstick SMALL (*)    All other components within normal limits  URINE MICROSCOPIC-ADD ON - Abnormal; Notable for the following:    Squamous Epithelial / LPF MANY (*)    Bacteria, UA MANY (*)    All other components within normal limits  LIPASE, BLOOD  PREGNANCY, URINE    Imaging Review No results found.   EKG Interpretation None      Filed Vitals:   07/08/14 1101 07/08/14 1103 07/08/14 1338 07/08/14 1538  BP:  132/91 133/90 161/94  Pulse: 69  74 62  Temp: 97.8 F (36.6 C)     TempSrc: Oral     Resp: 18  18 16   SpO2: 100%  100% 99%     MDM   Final diagnoses:  Kidney stone    This is a 44 y.o. female with a history of kidney stones who presents to the emergency department complaining of left flank pain beginning  around 3 hours prior to arrival.  Patient is diagnosed with a 6 mm left obstructing stone on 06/25/2014 and [facility]. Her pain has resolved since then to return today. She reports her pain feels the same as her previous kidney stone. Patient is afebrile and nontoxic appearing. Patient has mild left lower quadrant tenderness to palpation.  Pregnancy test is negative. Urinalysis is negative for infection and shows small hemoglobin. CBC shows a mildly elevated white count of 12.1. CMP is unremarkable. Will work on pain control.  At 15:10 Patient's pain is still 7/10. Will consult urology.  At 15:20 Spoke with Dr. Sherron MondayMacDiarmid from urology who would like me to try Toradol and morphine to work on pain control and if her pain persists to give them a call back.  At 16:30 Patient reports her pain has completley resolved after toradol and morphine. Will discharge her with close follow up with urology.  Patient provided a prescription for Norco for pain  control.  Narcotic pain medicine precautions provided. I advised the patient to follow-up with their primary care provider this week and urology as soon as possible. I advised the patient to return to the emergency department with new or worsening symptoms or new concerns. The patient verbalized understanding and agreement with plan.    This patient was discussed with Dr. Anitra Lauth who agrees with assessment and plan.    Jaclyn Farrier, PA-C 07/08/14 1639  Gwyneth Sprout, MD 07/09/14 [phone]

## 2014-07-08 NOTE — ED Notes (Signed)
Pt c/o abdominal pain and back pain, went [facility] on 3[facility]11, diagnosed with 6 cm ureter stone, was instructed to return if pain was not relieved in 1 week, pt states pain never decreased.

## 2014-07-08 NOTE — Discharge Instructions (Signed)

## 2014-07-13 ENCOUNTER — Emergency Department (HOSPITAL_COMMUNITY): Payer: Self-pay

## 2014-07-13 ENCOUNTER — Encounter (HOSPITAL_COMMUNITY): Payer: Self-pay | Admitting: Emergency Medicine

## 2014-07-13 ENCOUNTER — Emergency Department (HOSPITAL_COMMUNITY)
Admission: EM | Admit: 2014-07-13 | Discharge: 2014-07-13 | Disposition: A | Discharge status: Home / Self Care | Payer: Self-pay | Attending: Emergency Medicine | Admitting: Emergency Medicine

## 2014-07-13 DIAGNOSIS — Z3202 Encounter for pregnancy test, result negative: Secondary | ICD-10-CM | POA: Insufficient documentation

## 2014-07-13 DIAGNOSIS — N23 Unspecified renal colic: Secondary | ICD-10-CM | POA: Insufficient documentation

## 2014-07-13 DIAGNOSIS — J45909 Unspecified asthma, uncomplicated: Secondary | ICD-10-CM | POA: Insufficient documentation

## 2014-07-13 LAB — PREGNANCY, URINE: Preg Test, Ur: NEGATIVE

## 2014-07-13 LAB — URINALYSIS, ROUTINE W REFLEX MICROSCOPIC
Bilirubin Urine: NEGATIVE
Glucose, UA: NEGATIVE mg/dL
Hgb urine dipstick: NEGATIVE
KETONES UR: 15 mg/dL — AB
LEUKOCYTES UA: NEGATIVE
Nitrite: NEGATIVE
PROTEIN: NEGATIVE mg/dL
Specific Gravity, Urine: 1.02 (ref 1.005–1.030)
Urobilinogen, UA: 0.2 mg/dL (ref 0.0–1.0)
pH: 6 (ref 5.0–8.0)

## 2014-07-13 LAB — CBC WITH DIFFERENTIAL/PLATELET
BASOS ABS: 0.1 10*3/uL (ref 0.0–0.1)
BASOS PCT: 1 % (ref 0–1)
EOS ABS: 0.5 10*3/uL (ref 0.0–0.7)
Eosinophils Relative: 4 % (ref 0–5)
HEMATOCRIT: 38.2 % (ref 36.0–46.0)
Hemoglobin: 12.4 g/dL (ref 12.0–15.0)
LYMPHS ABS: 2.6 10*3/uL (ref 0.7–4.0)
LYMPHS PCT: 23 % (ref 12–46)
MCH: 27.7 pg (ref 26.0–34.0)
MCHC: 32.5 g/dL (ref 30.0–36.0)
MCV: 85.3 fL (ref 78.0–100.0)
MONOS PCT: 8 % (ref 3–12)
Monocytes Absolute: 1 10*3/uL (ref 0.1–1.0)
Neutro Abs: 7.4 10*3/uL (ref 1.7–7.7)
Neutrophils Relative %: 64 % (ref 43–77)
Platelets: 314 10*3/uL (ref 150–400)
RBC: 4.48 MIL/uL (ref 3.87–5.11)
RDW: 14 % (ref 11.5–15.5)
WBC: 11.6 10*3/uL — AB (ref 4.0–10.5)

## 2014-07-13 LAB — BASIC METABOLIC PANEL
Anion gap: 7 (ref 5–15)
BUN: 15 mg/dL (ref 6–23)
CHLORIDE: 99 mmol/L (ref 96–112)
CO2: 28 mmol/L (ref 19–32)
Calcium: 9.1 mg/dL (ref 8.4–10.5)
Creatinine, Ser: 1.47 mg/dL — ABNORMAL HIGH (ref 0.50–1.10)
GFR calc Af Amer: 49 mL/min — ABNORMAL LOW (ref >90)
GFR calc non Af Amer: 42 mL/min — ABNORMAL LOW (ref >90)
Glucose, Bld: 101 mg/dL — ABNORMAL HIGH (ref 70–99)
Potassium: 4.4 mmol/L (ref 3.5–5.1)
Sodium: 134 mmol/L — ABNORMAL LOW (ref 135–145)

## 2014-07-13 IMAGING — US US RENAL
1 series · 14 of 25 positions shown · non-contrast
Comparison: CT abdomen and pelvis [DATE] and abdominal
radiograph [DATE]

CLINICAL DATA: Left ureteral calculus

EXAM:
RENAL/URINARY TRACT ULTRASOUND COMPLETE

[Series 1: us renal · 0.21mm/px · 14 of 87 slices shown]
[im 1/87]
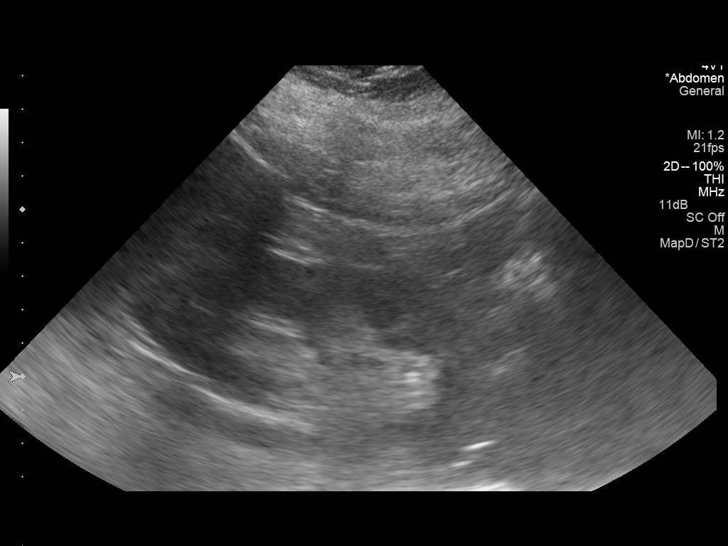
[im 8/87]
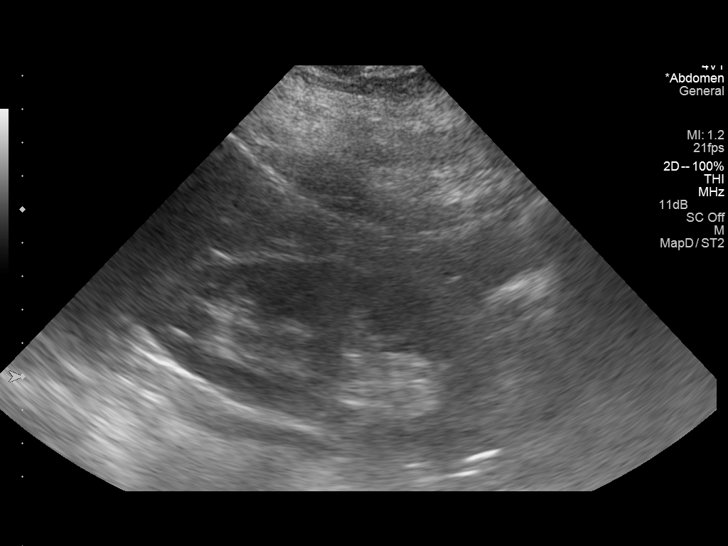
[im 15/87]
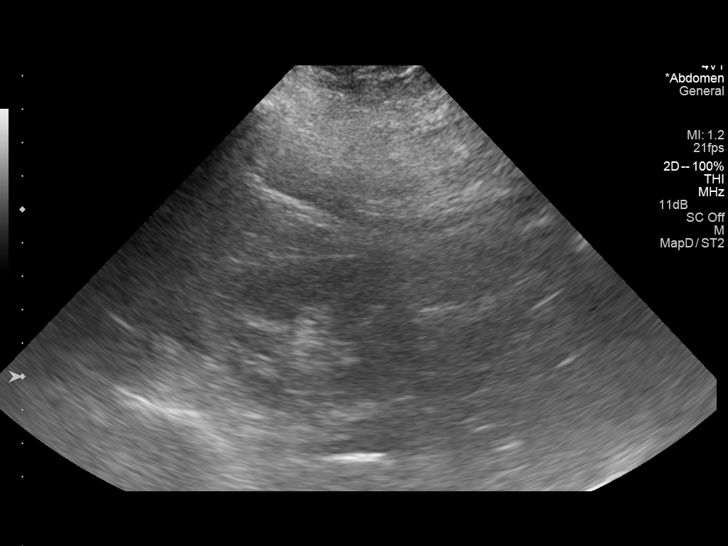
[im 22/87]
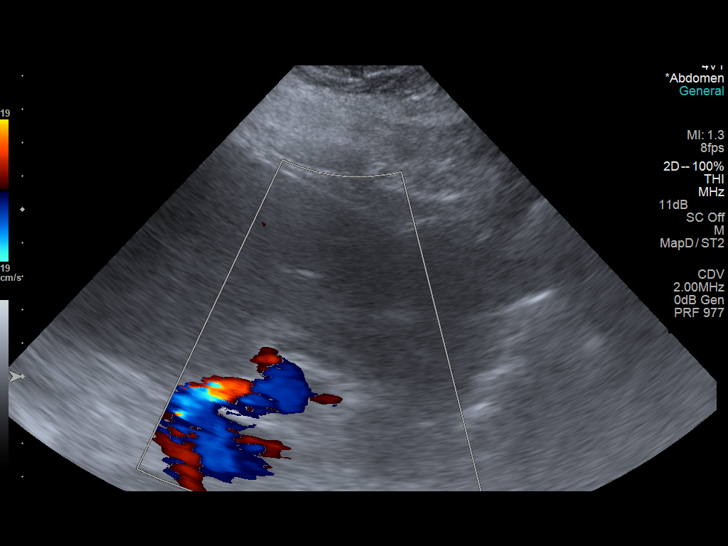
[im 29/87]
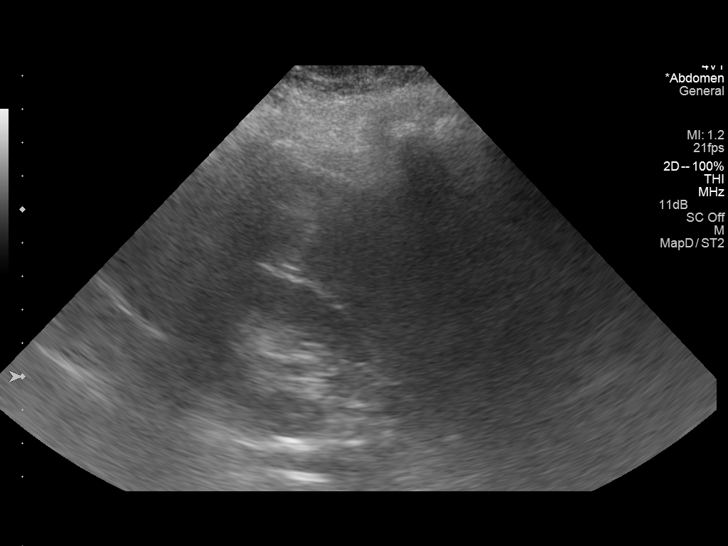
[im 33/87]
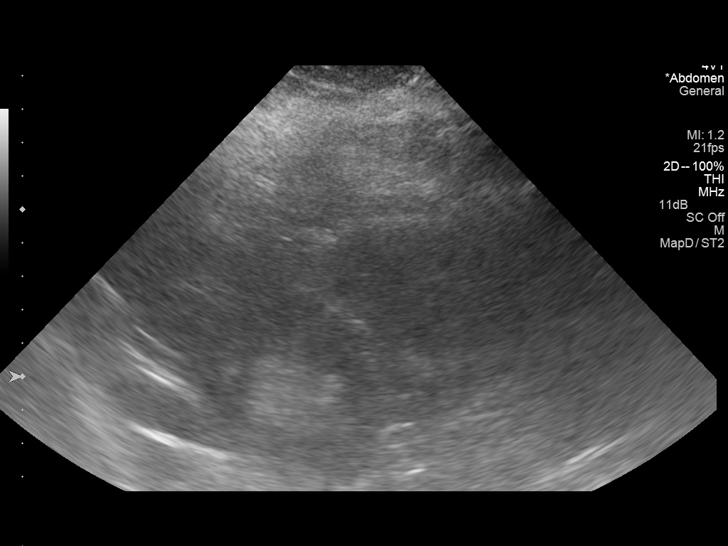
[im 40/87]
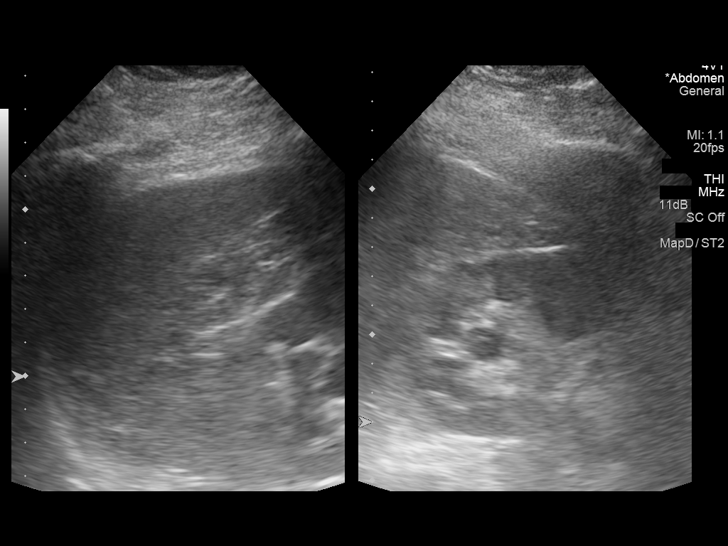
[im 47/87]
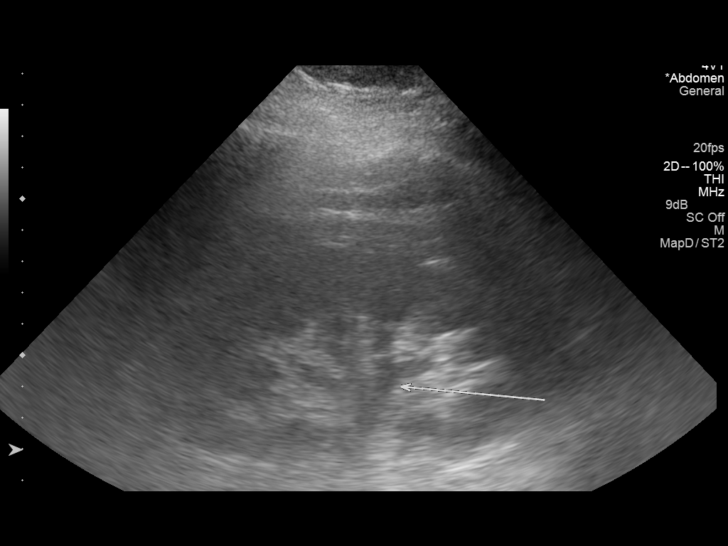
[im 54/87]
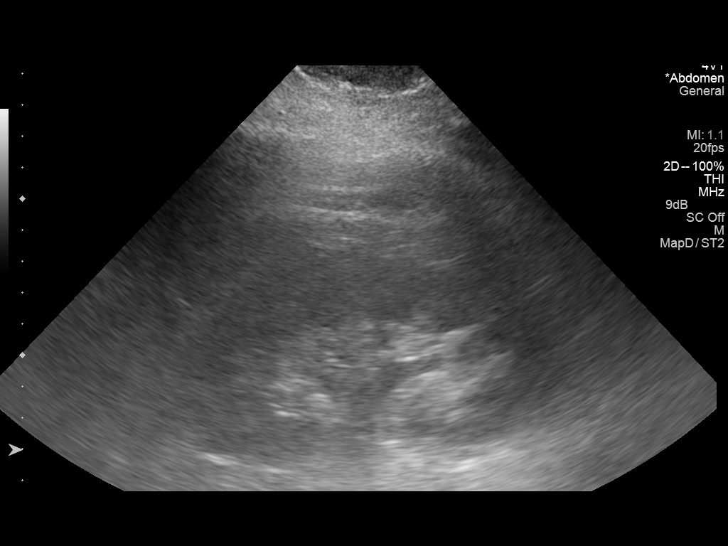
[im 58/87]
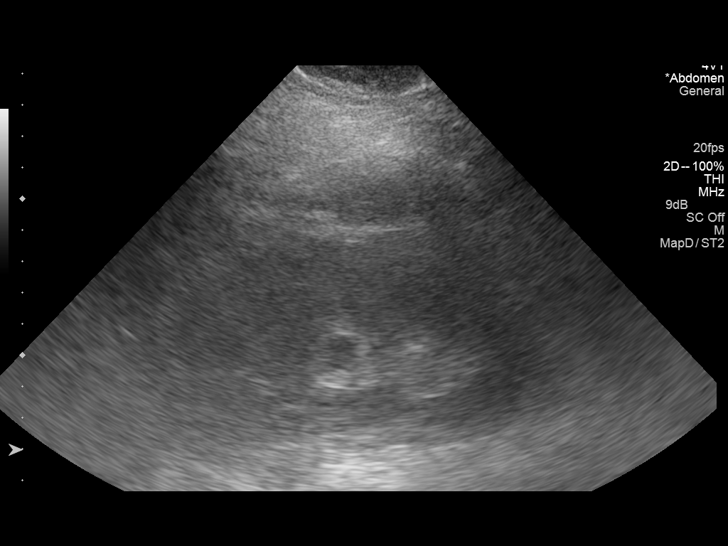
[im 65/87]
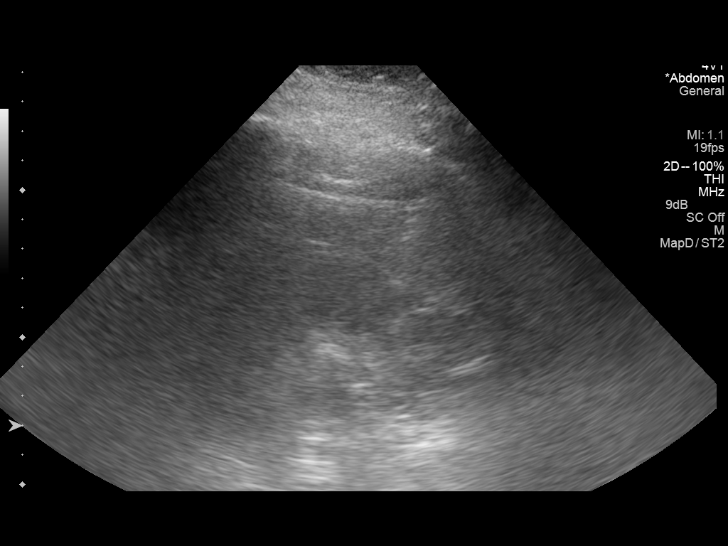
[im 72/87]
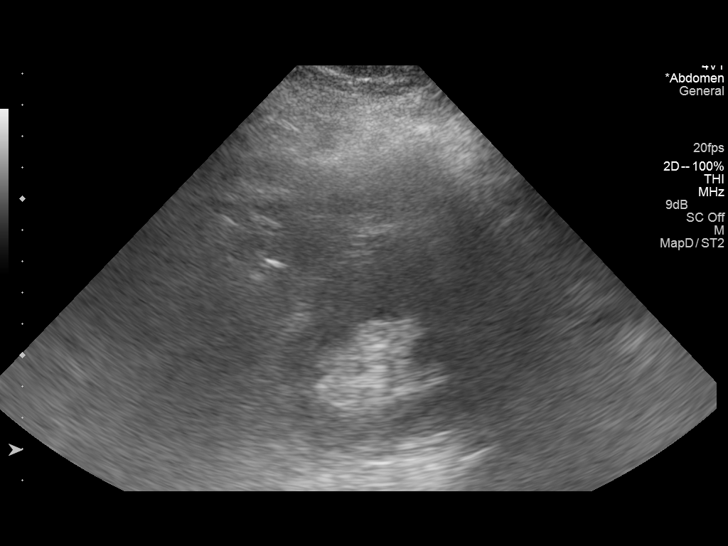
[im 79/87]
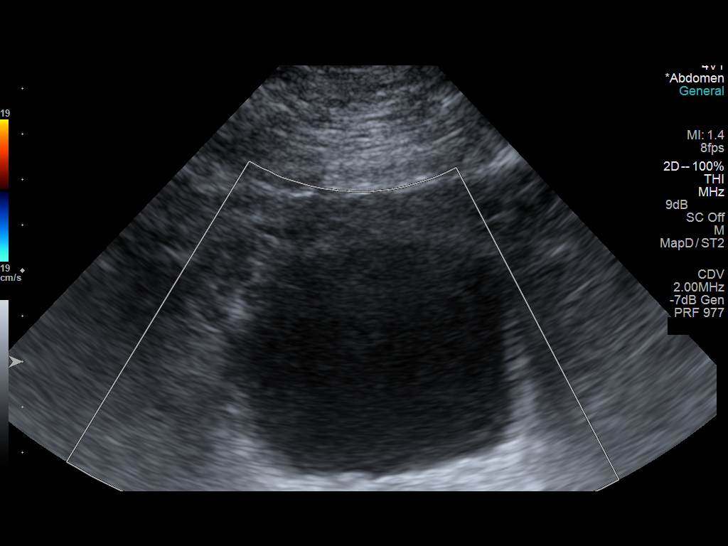
[im 87/87]
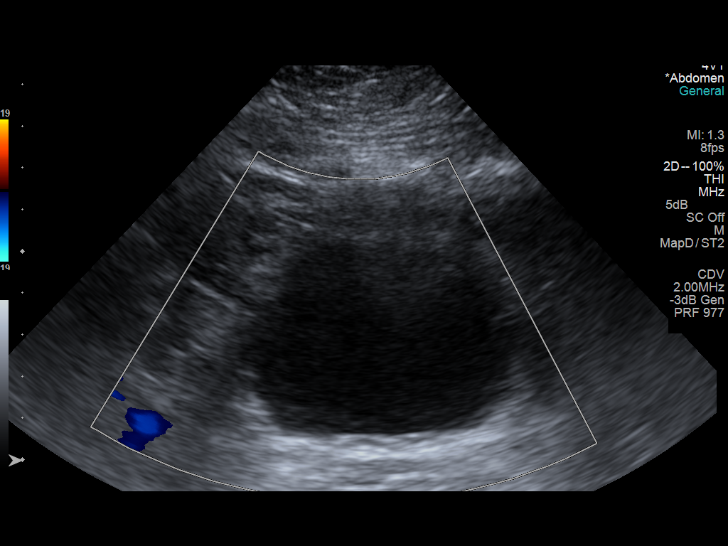

[14 of 25 positions shown; findings below may reference images not displayed]

FINDINGS: Right Kidney:

Length: 11.7 cm. Echogenicity and renal cortical thickness are
within normal limits. No mass, perinephric fluid, or hydronephrosis
visualized. No sonographically demonstrable calculus or
ureterectasis.

Left Kidney:

Length: 12.2 cm. Echogenicity and renal cortical thickness are
within normal limits. No mass or perinephric fluid visualized. Mild
pelvicaliectasis on the left is noted. There is no appreciable
ureteral dilatation. No renal or ureteral calculus is seen by
ultrasound.

Bladder:

Appears normal for degree of bladder distention.
IMPRESSION: Mild fullness of the left renal collecting system. Persistent
obstruction from apparent proximal ureteral calculus seen on
radiograph earlier today suspected. Study otherwise unremarkable.

## 2014-07-13 MED ORDER — IBUPROFEN 800 MG PO TABS: 800.0000 mg | ORAL_TABLET | Freq: Three times a day (TID) | ORAL | Status: DC

## 2014-07-13 MED ORDER — HYDROMORPHONE HCL 1 MG/ML IJ SOLN
1.0000 mg | Freq: Once | INTRAMUSCULAR | Status: AC
  Administered 2014-07-13: 1 mg via INTRAVENOUS
  Filled 2014-07-13: qty 1

## 2014-07-13 MED ORDER — HYDROCODONE-ACETAMINOPHEN 5-325 MG PO TABS: 1.0000 | ORAL_TABLET | ORAL | Status: DC | PRN

## 2014-07-13 MED ORDER — ONDANSETRON HCL 4 MG/2ML IJ SOLN
4.0000 mg | Freq: Once | INTRAMUSCULAR | Status: AC
  Administered 2014-07-13: 4 mg via INTRAVENOUS
  Filled 2014-07-13: qty 2

## 2014-07-13 MED ORDER — KETOROLAC TROMETHAMINE 30 MG/ML IJ SOLN
30.0000 mg | Freq: Once | INTRAMUSCULAR | Status: AC
  Administered 2014-07-13: 30 mg via INTRAVENOUS
  Filled 2014-07-13: qty 1

## 2014-07-13 NOTE — ED Notes (Signed)
MD at bedside. 

## 2014-07-13 NOTE — Discharge Instructions (Signed)
Kidney Stones Follow up with the urologist. Ask for the "resident clinic" and you will not be charged. Take pain medication as prescribed. Return to the ED if you develop new or worsening symptoms. Kidney stones (urolithiasis) are deposits that form inside your kidneys. The intense pain is caused by the stone moving through the urinary tract. When the stone moves, the ureter goes into spasm around the stone. The stone is usually passed in the urine.  CAUSES   A disorder that makes certain neck glands produce too much parathyroid hormone (primary hyperparathyroidism).  A buildup of uric acid crystals, similar to gout in your joints.  Narrowing (stricture) of the ureter.  A kidney obstruction present at birth (congenital obstruction).  Previous surgery on the kidney or ureters.  Numerous kidney infections. SYMPTOMS   Feeling sick to your stomach (nauseous).  Throwing up (vomiting).  Blood in the urine (hematuria).  Pain that usually spreads (radiates) to the groin.  Frequency or urgency of urination. DIAGNOSIS   Taking a history and physical exam.  Blood or urine tests.  CT scan.  Occasionally, an examination of the inside of the urinary bladder (cystoscopy) is performed. TREATMENT   Observation.  Increasing your fluid intake.  Extracorporeal shock wave lithotripsy--This is a noninvasive procedure that uses shock waves to break up kidney stones.  Surgery may be needed if you have severe pain or persistent obstruction. There are various surgical procedures. Most of the procedures are performed with the use of small instruments. Only small incisions are needed to accommodate these instruments, so recovery time is minimized. The size, location, and chemical composition are all important variables that will determine the proper choice of action for you. Talk to your health care provider to better understand your situation so that you will minimize the risk of injury to yourself  and your kidney.  HOME CARE INSTRUCTIONS   Drink enough water and fluids to keep your urine clear or pale yellow. This will help you to pass the stone or stone fragments.  Strain all urine through the provided strainer. Keep all particulate matter and stones for your health care provider to see. The stone causing the pain may be as small as a grain of salt. It is very important to use the strainer each and every time you pass your urine. The collection of your stone will allow your health care provider to analyze it and verify that a stone has actually passed. The stone analysis will often identify what you can do to reduce the incidence of recurrences.  Only take over-the-counter or prescription medicines for pain, discomfort, or fever as directed by your health care provider.  Make a follow-up appointment with your health care provider as directed.  Get follow-up X-rays if required. The absence of pain does not always mean that the stone has passed. It may have only stopped moving. If the urine remains completely obstructed, it can cause loss of kidney function or even complete destruction of the kidney. It is your responsibility to make sure X-rays and follow-ups are completed. Ultrasounds of the kidney can show blockages and the status of the kidney. Ultrasounds are not associated with any radiation and can be performed easily in a matter of minutes. SEEK MEDICAL CARE IF:  You experience pain that is progressive and unresponsive to any pain medicine you have been prescribed. SEEK IMMEDIATE MEDICAL CARE IF:   Pain cannot be controlled with the prescribed medicine.  You have a fever or shaking chills.  The  severity or intensity of pain increases over 18 hours and is not relieved by pain medicine.  You develop a new onset of abdominal pain.  You feel faint or pass out.  You are unable to urinate. MAKE SURE YOU:   Understand these instructions.  Will watch your condition.  Will get  help right away if you are not doing well or get worse. Document Released: 04/02/2005 Document Revised: 12/03/2012 Document Reviewed: 09/03/2012 [facility] Patient Information 2015 [place] [employer] [place]. This information is not intended to replace advice given to you by your health care provider. Make sure you discuss any questions you have with your health care provider.

## 2014-07-13 NOTE — ED Notes (Signed)
Pt presents to ED complaining of a kidney stone that has not passed since first onset of symptoms 3/11.  [facility] but the pain is still unrelieved.  Pt has been treating pain with Vicodin but she has not taken any today.  Pt currently reports pain level at 9/10 in the left flank.

## 2014-07-13 NOTE — ED Notes (Signed)
Patient with no complaints at this time. Respirations even and unlabored. Skin warm/dry. Discharge instructions reviewed with patient at this time. Patient given opportunity to voice concerns/ask questions. IV removed per policy and band-aid applied to site. Patient discharged at this time and left Emergency Department with steady gait.  

## 2014-07-13 NOTE — ED Provider Notes (Signed)
CSN: [id number]     Arrival date & time 07/13/14  1238 History  This chart was scribed for No att. providers found by Tonye RoyaltyJoshua Chen, ED Scribe. This patient was seen in room APA09/APA09 and the patient's care was started at 1:06 PM.    Chief Complaint  Patient presents with  . Nephrolithiasis   The history is provided by the patient. No language interpreter was used.    HPI Comments: Jaclyn Ruiz is a 44 y.o. female who presents to the Emergency Department complaining of left flank and left abdominal pain. She had kidney stones diagnosed 3/11, she states pain has been persistent and constant. CT scan on 3/12 revealed 6mm stone on left side. She states this is the same pain for which she was evaluated on 3/11 and 3/24. Pain improves with narcotics, Vicodin and Percocet, last used yesterday. She states she still has some Percocet. She states Flomax did not seem to help. She reports intermittent subjective fever and hot flashes. She has had nausea and vomiting; last emesis was yesterday and was yellow. She denies previous kidney stones. [facility], she states she does not have money to see a urologist. She does not think she passed the stone. Only prior abdominal surgeries are C-section and endometrial ablation. She still has uterus and ovaries. She does not have periods. She denies SOB or chest pain.  Past Medical History  Diagnosis Date  . Asthma    Past Surgical History  Procedure Laterality Date  . Cesarean section      times 2  . Endometrial ablation  2008   History reviewed. No pertinent family history. History  Substance Use Topics  . Smoking status: Never Smoker   . Smokeless tobacco: Not on file  . Alcohol Use: No   OB History    No data available     Review of Systems A complete 10 system review of systems was obtained and all systems are negative except as noted in the HPI and PMH.    Allergies  Review of patient's allergies  indicates no known allergies.  Home Medications   Prior to Admission medications   Medication Sig Start Date End Date Taking? Authorizing Provider  acetaminophen (TYLENOL) 650 MG CR tablet Take 1,300 mg by mouth daily as needed for pain.   Yes Historical Provider, MD  oxyCODONE-acetaminophen (PERCOCET/ROXICET) 5-325 MG per tablet Take 1-2 tablets by mouth every 6 (six) hours as needed for severe pain. 06/26/14  Yes Devoria AlbeIva Knapp, MD  HYDROcodone-acetaminophen (NORCO/VICODIN) 5-325 MG per tablet Take 1 tablet by mouth every 4 (four) hours as needed. 07/13/14   Glynn OctaveStephen Quanta Roher, MD  ibuprofen (ADVIL,MOTRIN) 800 MG tablet Take 1 tablet (800 mg total) by mouth 3 (three) times daily. 07/13/14   Glynn OctaveStephen Torris House, MD  ondansetron (ZOFRAN ODT) 4 MG disintegrating tablet Take 1 tablet (4 mg total) by mouth every 8 (eight) hours as needed for nausea or vomiting. Patient not taking: Reported on 07/13/2014 06/26/14   Devoria AlbeIva Knapp, MD  tamsulosin (FLOMAX) 0.4 MG CAPS capsule Take 1 po QD until you pass the stone. Patient not taking: Reported on 07/13/2014 06/26/14   Devoria AlbeIva Knapp, MD   BP 126/84 mmHg  Pulse 70  Temp(Src) 97.8 F (36.6 C) (Oral)  Resp 17  Ht 5\' 3"  (1.6 m)  Wt 282 lb 6.4 oz (128.096 kg)  BMI 50.04 kg/m2  SpO2 94% Physical Exam  Constitutional: She is oriented to person, place,  and time. She appears well-developed and well-nourished. No distress.  uncomfortable Non-toxic  HENT:  Head: Normocephalic and atraumatic.  Mouth/Throat: Oropharynx is clear and moist. No oropharyngeal exudate.  Eyes: Conjunctivae and EOM are normal. Pupils are equal, round, and reactive to light.  Neck: Normal range of motion. Neck supple.  No meningismus.  Cardiovascular: Normal rate, regular rhythm, normal heart sounds and intact distal pulses.   No murmur heard. Pulmonary/Chest: Effort normal and breath sounds normal. No respiratory distress.  Abdominal: Soft. There is tenderness. There is no rebound and no guarding.   Left CVA tenderness Left abdominal tenderness without peritoneal signs  Musculoskeletal: Normal range of motion. She exhibits no edema or tenderness.  Neurological: She is alert and oriented to person, place, and time. No cranial nerve deficit. She exhibits normal muscle tone. Coordination normal.  No ataxia on finger to nose bilaterally. No pronator drift. 5/5 strength throughout. CN 2-12 intact. Equal grip strength. Sensation intact.   Skin: Skin is warm.  Psychiatric: She has a normal mood and affect. Her behavior is normal.  Nursing note and vitals reviewed.   ED Course  Procedures (including critical care time)  DIAGNOSTIC STUDIES: Oxygen Saturation is 99% on room air, normal by my interpretation.    COORDINATION OF CARE: 1:14 PM Discussed treatment plan with patient at beside, including x-ray. The patient agrees with the plan and has no further questions at this time.   Labs Review Labs Reviewed  URINALYSIS, ROUTINE W REFLEX MICROSCOPIC - Abnormal; Notable for the following:    Ketones, ur 15 (*)    All other components within normal limits  CBC WITH DIFFERENTIAL/PLATELET - Abnormal; Notable for the following:    WBC 11.6 (*)    All other components within normal limits  BASIC METABOLIC PANEL - Abnormal; Notable for the following:    Sodium 134 (*)    Glucose, Bld 101 (*)    Creatinine, Ser 1.47 (*)    GFR calc non Af Amer 42 (*)    GFR calc Af Amer 49 (*)    All other components within normal limits  PREGNANCY, URINE    Imaging Review Dg Abd 1 View  07/13/2014   CLINICAL DATA:  Flank pain.  Left renal calculus  EXAM: ABDOMEN - 1 VIEW  COMPARISON:  CT abdomen and pelvis June 26, 2014  FINDINGS: There is a focal calculus measuring 7 x 6 mm at the level of L2-3 on the left. No other abnormal calcifications are identified. There is fairly diffuse stool in the colon. The bowel gas pattern is unremarkable. No obstruction or free air is seen on this supine examination.   IMPRESSION: 7 x 6 mm calcification probably in the proximal left ureter. Overall bowel gas pattern unremarkable.   Electronically Signed   By: Bretta Bang III M.D.   On: 07/13/2014 14:07   US Renal  07/13/2014   CLINICAL DATA:  Left ureteral calculus  EXAM: RENAL/URINARY TRACT ULTRASOUND COMPLETE  COMPARISON:  CT abdomen and pelvis June 26, 2014 and abdominal radiograph July 13, 2014  FINDINGS: Right Kidney:  Length: 11.7 cm. Echogenicity and renal cortical thickness are within normal limits. No mass, perinephric fluid, or hydronephrosis visualized. No sonographically demonstrable calculus or ureterectasis.  Left Kidney:  Length: 12.2 cm. Echogenicity and renal cortical thickness are within normal limits. No mass or perinephric fluid visualized. Mild pelvicaliectasis on the left is noted. There is no appreciable ureteral dilatation. No renal or ureteral calculus is seen by ultrasound.  Bladder:  Appears normal for degree of bladder distention.  IMPRESSION: Mild fullness of the left renal collecting system. Persistent obstruction from apparent proximal ureteral calculus seen on radiograph earlier today suspected. Study otherwise unremarkable.   Electronically Signed   By: Bretta Bang III M.D.   On: 07/13/2014 15:45     EKG Interpretation None      MDM   Final diagnoses:  Renal colic   ongoing left-sided flank pain since March 12 when patient was diagnosed with kidney stone. Associated with nausea and vomiting. She's been taking Vicodin without relief. She has not yet seen urology. She does not have insurance.  Pain is constant, improved with Vicodin. She did not take any today.  X-ray shows 7 x 6 mm stone still in the proximal ureter. Persistent hydronephrosis on ultrasound.  Patient's pain has improved in the ED. No vomiting. Creatinine slightly worse at 1.47.  D/w Dr. Patsi Sears.  States patient can be seen in resident clinic which will avoid her financial difficulty. Her pain  is controlled and she is tolerating PO.  Vicodin refilled as she said that worked better than percocet. Return to the ED with worsening pain, inability to tolerate PO, or fever.    I personally performed the services described in this documentation, which was scribed in my presence. The recorded information has been reviewed and is accurate.   Glynn Octave, MD 07/13/14 [phone]

## 2014-07-14 ENCOUNTER — Other Ambulatory Visit: Payer: Self-pay | Admitting: Urology

## 2014-07-15 ENCOUNTER — Encounter (HOSPITAL_COMMUNITY): Payer: Self-pay | Admitting: *Deleted

## 2014-07-19 ENCOUNTER — Other Ambulatory Visit: Payer: Self-pay | Admitting: Urology

## 2014-07-19 ENCOUNTER — Ambulatory Visit (HOSPITAL_COMMUNITY)
Admission: RE | Admit: 2014-07-19 | Discharge: 2014-07-19 | Disposition: A | Discharge status: Home / Self Care | Payer: Self-pay | Source: Ambulatory Visit | Attending: Urology | Admitting: Urology

## 2014-07-19 ENCOUNTER — Encounter (HOSPITAL_COMMUNITY)
Admission: RE | Disposition: A | Discharge status: Home / Self Care | Payer: Self-pay | Source: Ambulatory Visit | Attending: Urology

## 2014-07-19 ENCOUNTER — Ambulatory Visit: Payer: Self-pay | Admitting: Internal Medicine

## 2014-07-19 HISTORY — DX: Calculus of kidney: N20.0

## 2014-07-19 SURGERY — LITHOTRIPSY, ESWL
Anesthesia: LOCAL | Laterality: Left

## 2014-07-19 NOTE — H&P (Signed)
Jaclyn Ruiz is an 44 y.o. female.    Chief Complaint: Pre-Op Left Shockwave Lithotripsy  HPI:    1 - Left Ureteral Stone - left proximal ureteral stone bt CT 06/26/2014 and persistent by KUB 3/29 with mild hydro by ER eval. Stone is 6mm, SSD 16cm, 850HU and adjacent to L2-L3 vertebral body. NO additional stones. NO fevers. Cr <1.5 3/29.   PMH sig for obesity, Appy, and sciatica  Today " Jaclyn Ruiz " is seen to proceed wtih left shockwve lithotripsy. No interval fevers or stone passage.    Past Medical History  Diagnosis Date  . Asthma   . Renal stones     Past Surgical History  Procedure Laterality Date  . Cesarean section      times 2  . Endometrial ablation  2008    History reviewed. No pertinent family history. Social History:  reports that she has never smoked. She does not have any smokeless tobacco history on file. She reports that she does not drink alcohol or use illicit drugs.  Allergies: No Known Allergies  No prescriptions prior to admission    No results found for this or any previous visit (from the past 48 hour(s)). No results found.  Review of Systems  Constitutional: Negative.   HENT: Negative.   Eyes: Negative.   Respiratory: Negative.   Cardiovascular: Negative.   Gastrointestinal: Negative.   Genitourinary: Positive for flank pain.  Musculoskeletal: Negative.   Skin: Negative.   Neurological: Negative.   Endo/Heme/Allergies: Negative.   Psychiatric/Behavioral: Negative.     Height 5\' 3"  (1.6 m), weight 127.914 kg (282 lb). Physical Exam  Constitutional: She appears well-developed.  HENT:  Head: Normocephalic.  Eyes: Pupils are equal, round, and reactive to light.  Neck: Normal range of motion.  Cardiovascular: Normal rate.   Respiratory: Effort normal.  GI: Soft.  Genitourinary:  Mild left CVAT.   Musculoskeletal: Normal range of motion.  Neurological: She is alert.  Skin: Skin is warm.  Psychiatric: She has a normal mood and  affect. Her behavior is normal. Judgment and thought content normal.     Assessment/Plan    1 - Left Ureteral Stone - Proceed today as planned with Left SWL.  We rediscussed shockwave lithotripsy in detail as well as my "rule of 9s" with stones <749mm, less than 900 HU, and skin to stone distance <9cm having approximately 90% treatment success with single session of treatment. We then readdressed how stones that are larger, more dense, and in patients with less favorable anatomy have incrementally decreased success rates. We rediscussed risks including, bleeding, infection, hematoma, loss of kidney, need for staged therapy, need for adjunctive therapy and requirement to refrain from any anticoagulants, anti-platelet or aspirin-like products peri-procedureally.   After careful consideration, the patient has chosen to proceed  [facility], Jaclyn Ruiz 07/19/2014, 12:29 PM

## 2014-07-19 NOTE — Progress Notes (Signed)
Patient arrived to short stay at 1530. When asked last po intake she said that 30 minutes ago she drank 16oz of Sprite. Called Joni ReiningNicole RN on litho truck to make aware. She called Dr. Berneice HeinrichManny and patient's litho cancelled for today. Office will call back to reschedule with her. Patient informed of npo 6 hours before procedure per RN when called from short stay 3/31. Also she signed pre-esl patient instructions (in blue folder given by office) stating nothing to drink 4 hours prior to litho. Patient informed that litho was cancelled.

## 2014-07-20 ENCOUNTER — Encounter (HOSPITAL_COMMUNITY): Payer: Self-pay | Admitting: *Deleted

## 2014-07-20 ENCOUNTER — Other Ambulatory Visit: Payer: Self-pay | Admitting: Urology

## 2014-07-21 MED ORDER — GENTAMICIN SULFATE 40 MG/ML IJ SOLN
5.0000 mg/kg | INTRAVENOUS | Status: AC
  Administered 2014-07-22: 410 mg via INTRAVENOUS
  Filled 2014-07-21: qty 10.25

## 2014-07-22 ENCOUNTER — Encounter (HOSPITAL_COMMUNITY): Payer: Self-pay | Admitting: General Practice

## 2014-07-22 ENCOUNTER — Encounter (HOSPITAL_COMMUNITY)
Admission: RE | Disposition: A | Discharge status: Home / Self Care | Payer: Self-pay | Source: Ambulatory Visit | Attending: Urology

## 2014-07-22 ENCOUNTER — Ambulatory Visit (HOSPITAL_COMMUNITY)
Admission: RE | Admit: 2014-07-22 | Discharge: 2014-07-22 | Disposition: A | Discharge status: Home / Self Care | Payer: Self-pay | Source: Ambulatory Visit | Attending: Urology | Admitting: Urology

## 2014-07-22 ENCOUNTER — Ambulatory Visit (HOSPITAL_COMMUNITY): Payer: Self-pay

## 2014-07-22 DIAGNOSIS — J45909 Unspecified asthma, uncomplicated: Secondary | ICD-10-CM | POA: Insufficient documentation

## 2014-07-22 DIAGNOSIS — E669 Obesity, unspecified: Secondary | ICD-10-CM | POA: Insufficient documentation

## 2014-07-22 DIAGNOSIS — Z87442 Personal history of urinary calculi: Secondary | ICD-10-CM | POA: Insufficient documentation

## 2014-07-22 DIAGNOSIS — N201 Calculus of ureter: Secondary | ICD-10-CM | POA: Insufficient documentation

## 2014-07-22 DIAGNOSIS — Z6841 Body Mass Index (BMI) 40.0 and over, adult: Secondary | ICD-10-CM | POA: Insufficient documentation

## 2014-07-22 SURGERY — LITHOTRIPSY, ESWL
Anesthesia: LOCAL | Laterality: Left

## 2014-07-22 MED ORDER — DIAZEPAM 5 MG PO TABS
10.0000 mg | ORAL_TABLET | ORAL | Status: AC
  Administered 2014-07-22: 10 mg via ORAL
  Filled 2014-07-22: qty 2

## 2014-07-22 MED ORDER — DIPHENHYDRAMINE HCL 25 MG PO CAPS
25.0000 mg | ORAL_CAPSULE | ORAL | Status: AC
  Administered 2014-07-22: 25 mg via ORAL
  Filled 2014-07-22: qty 1

## 2014-07-22 MED ORDER — HYDROCODONE-ACETAMINOPHEN 5-325 MG PO TABS
1.0000 | ORAL_TABLET | Freq: Four times a day (QID) | ORAL | Status: AC | PRN
  Administered 2014-07-22: 2 via ORAL
  Filled 2014-07-22: qty 2

## 2014-07-22 MED ORDER — HYDROCODONE-ACETAMINOPHEN 5-325 MG PO TABS: 1.0000 | ORAL_TABLET | Freq: Four times a day (QID) | ORAL | Status: DC | PRN

## 2014-07-22 MED ORDER — SODIUM CHLORIDE 0.9 % IV SOLN
INTRAVENOUS | Status: DC
  Administered 2014-07-22: 12:00:00 via INTRAVENOUS

## 2014-07-22 NOTE — Progress Notes (Signed)
Post ESWL pt has left flank softball sized pink area.

## 2014-07-22 NOTE — H&P (Signed)
Jaclyn Ruiz is an 44 y.o. female.    Chief Complaint: Pre-OP Left Shockwave Lithotripsy  HPI:    1 - Left Ureteral Stone - left proximal ureteral stone bt CT 06/26/2014 and persistent by KUB 3/29 with mild hydro by ER eval. Stone is 6mm, SSD 16cm, 850HU and adjacent to L2-L3 vertebral body. NO additional stones. NO fevers. Cr <1.5 3/29.   PMH sig for obesity, Appy, and sciatica  Today " Jaclyn Ruiz " is seen to proceed wtih left shockwve lithotripsy. No interval fevers or stone passage. She was to have procedure several days ago but was not NPO as instructed.   Past Medical History  Diagnosis Date  . Asthma   . Renal stones     Past Surgical History  Procedure Laterality Date  . Cesarean section      times 2  . Endometrial ablation  2008  . Tubal ligation  1994    bilateral tubal ligation    History reviewed. No pertinent family history. Social History:  reports that she has never smoked. She does not have any smokeless tobacco history on file. She reports that she does not drink alcohol or use illicit drugs.  Allergies: No Known Allergies  No prescriptions prior to admission    No results found for this or any previous visit (from the past 48 hour(s)). No results found.  Review of Systems  Constitutional: Negative.   HENT: Negative.   Eyes: Negative.   Respiratory: Negative.   Cardiovascular: Negative.   Gastrointestinal: Negative.   Genitourinary:       Moderate intermitnat left flank pain   Musculoskeletal: Negative.   Skin: Negative.   Neurological: Negative.   Endo/Heme/Allergies: Negative.   Psychiatric/Behavioral: Negative.     Last menstrual period 07/19/2009. Physical Exam  Constitutional: She appears well-developed.  HENT:  Head: Normocephalic.  Eyes: Pupils are equal, round, and reactive to light.  Neck: Normal range of motion.  Cardiovascular: Normal rate.   Respiratory: Effort normal.  GI: Soft.  Genitourinary:  Very mild left CVAT   Musculoskeletal: Normal range of motion.  Neurological: She is alert.  Skin: Skin is warm.  Psychiatric: She has a normal mood and affect. Her behavior is normal. Judgment and thought content normal.     Assessment/Plan  Jaclyn Ruiz is an 44 y.o. female.    Chief Complaint: Pre-Op Left Shockwave Lithotripsy  HPI:    1 - Left Ureteral Stone - left proximal ureteral stone bt CT 06/26/2014 and persistent by KUB 3/29 with mild hydro by ER eval. Stone is 6mm, SSD 16cm, 850HU and adjacent to L2-L3 vertebral body. NO additional stones. NO fevers. Cr <1.5 3/29.   PMH sig for obesity, Appy, and sciatica  Today " Jaclyn Ruiz " is seen to proceed wtih left shockwve lithotripsy. No interval fevers or stone passage.    Past Medical History  Diagnosis Date  . Asthma   . Renal stones     Past Surgical History  Procedure Laterality Date  . Cesarean section      times 2  . Endometrial ablation  2008    History reviewed. No pertinent family history. Social History:  reports that she has never smoked. She does not have any smokeless tobacco history on file. She reports that she does not drink alcohol or use illicit drugs.  Allergies: No Known Allergies  No prescriptions prior to admission    No results found for this or any previous visit (from the past 48  hour(s)). No results found.  Review of Systems  Constitutional: Negative.   HENT: Negative.   Eyes: Negative.   Respiratory: Negative.   Cardiovascular: Negative.   Gastrointestinal: Negative.   Genitourinary: Positive for flank pain.  Musculoskeletal: Negative.   Skin: Negative.   Neurological: Negative.   Endo/Heme/Allergies: Negative.   Psychiatric/Behavioral: Negative.     Height 5\' 3"  (1.6 m), weight 127.914 kg (282 lb). Physical Exam  Constitutional: She appears well-developed.  HENT:  Head: Normocephalic.  Eyes: Pupils are equal, round, and reactive to light.  Neck: Normal range of motion.  Cardiovascular:  Normal rate.   Respiratory: Effort normal.  GI: Soft.  Genitourinary:  Mild left CVAT.   Musculoskeletal: Normal range of motion.  Neurological: She is alert.  Skin: Skin is warm.  Psychiatric: She has a normal mood and affect. Her behavior is normal. Judgment and thought content normal.     Assessment/Plan    1 - Left Ureteral Stone - Proceed today as planned with Left SWL.  We rediscussed shockwave lithotripsy in detail as well as my "rule of 9s" with stones <569mm, less than 900 HU, and skin to stone distance <9cm having approximately 90% treatment success with single session of treatment. We then readdressed how stones that are larger, more dense, and in patients with less favorable anatomy have incrementally decreased success rates. We rediscussed risks including, bleeding, infection, hematoma, loss of kidney, need for staged therapy, need for adjunctive therapy and requirement to refrain from any anticoagulants, anti-platelet or aspirin-like products peri-procedureally.   After careful consideration, the patient has chosen to proceed  [name], [name] 07/19/2014, 12:29 PM     Rivaldo Hineman 07/22/2014, 8:17 AM

## 2014-07-22 NOTE — Brief Op Note (Signed)
07/22/2014  3:05 PM  PATIENT:  Jaclyn Ruiz  44 y.o. female  PRE-OPERATIVE DIAGNOSIS:  LEFT URETERAL STONE  POST-OPERATIVE DIAGNOSIS:  * No post-op diagnosis entered *  PROCEDURE:  Procedure(s): LEFT EXTRACORPOREAL SHOCK WAVE LITHOTRIPSY (ESWL) (Left)  SURGEON:  Surgeon(s) and Role:    * Sebastian Acheheodore Satoshi Kalas, MD - Primary  PHYSICIAN ASSISTANT:   ASSISTANTS: none   ANESTHESIA:   MAC  EBL:     BLOOD ADMINISTERED:none  DRAINS: none   LOCAL MEDICATIONS USED:  NONE  SPECIMEN:  No Specimen  DISPOSITION OF SPECIMEN:  N/A  COUNTS:  YES  TOURNIQUET:  * No tourniquets in log *  DICTATION: .Note written in paper chart  PLAN OF CARE: Discharge to home after PACU  PATIENT DISPOSITION:  PACU - hemodynamically stable.   Delay start of Pharmacological VTE agent (>24hrs) due to surgical blood loss or risk of bleeding: not applicable

## 2014-07-22 NOTE — Discharge Instructions (Signed)
1 - You may have urinary urgency (bladder spasms) and bloody urine on / off  And pass small stone fragments for several days. This is normal.  2 - Call MD or go to ER for fever >102, severe pain / nausea / vomiting not relieved by medications, or acute change in medical status

## 2017-01-28 ENCOUNTER — Emergency Department (HOSPITAL_COMMUNITY)
Admission: EM | Admit: 2017-01-28 | Discharge: 2017-01-28 | Disposition: A | Discharge status: Home / Self Care | Payer: Self-pay | Attending: Emergency Medicine | Admitting: Emergency Medicine

## 2017-01-28 ENCOUNTER — Encounter (HOSPITAL_COMMUNITY): Payer: Self-pay

## 2017-01-28 ENCOUNTER — Emergency Department (HOSPITAL_COMMUNITY): Payer: Self-pay

## 2017-01-28 DIAGNOSIS — J9801 Acute bronchospasm: Secondary | ICD-10-CM

## 2017-01-28 DIAGNOSIS — Z79899 Other long term (current) drug therapy: Secondary | ICD-10-CM | POA: Insufficient documentation

## 2017-01-28 MED ORDER — ALBUTEROL SULFATE (2.5 MG/3ML) 0.083% IN NEBU
5.0000 mg | INHALATION_SOLUTION | Freq: Once | RESPIRATORY_TRACT | Status: AC
  Administered 2017-01-28: 5 mg via RESPIRATORY_TRACT

## 2017-01-28 MED ORDER — BENZONATATE 100 MG PO CAPS: 100.0000 mg | ORAL_CAPSULE | Freq: Three times a day (TID) | ORAL | 0 refills | Status: DC | PRN

## 2017-01-28 MED ORDER — ALBUTEROL SULFATE (2.5 MG/3ML) 0.083% IN NEBU
INHALATION_SOLUTION | RESPIRATORY_TRACT | Status: AC
  Filled 2017-01-28: qty 6

## 2017-01-28 MED ORDER — ALBUTEROL SULFATE HFA 108 (90 BASE) MCG/ACT IN AERS
2.0000 | INHALATION_SPRAY | Freq: Once | RESPIRATORY_TRACT | Status: AC
  Administered 2017-01-28: 2 via RESPIRATORY_TRACT
  Filled 2017-01-28: qty 6.7

## 2017-01-28 MED ORDER — BENZONATATE 100 MG PO CAPS
100.0000 mg | ORAL_CAPSULE | Freq: Once | ORAL | Status: AC
Start: 2017-01-28 — End: 2017-01-28
  Administered 2017-01-28: 100 mg via ORAL
  Filled 2017-01-28: qty 1

## 2017-01-28 MED ORDER — DEXAMETHASONE 4 MG PO TABS
12.0000 mg | ORAL_TABLET | Freq: Once | ORAL | Status: AC
  Administered 2017-01-28: 12 mg via ORAL
  Filled 2017-01-28: qty 3

## 2017-01-28 MED ORDER — AEROCHAMBER PLUS FLO-VU LARGE MISC
1.0000 | Freq: Once | Status: AC
  Administered 2017-01-28: 1

## 2017-01-28 NOTE — ED Triage Notes (Signed)
Pt endorses asthma exacerbation that began last night with trouble breathing and unproductive cough. Pt states that this only happens when the weather changes. Denies fever, chills. Does not have a rescue inhaler. VSS.

## 2017-01-28 NOTE — ED Notes (Signed)
Pt taken to xray 

## 2017-01-28 NOTE — ED Provider Notes (Signed)
[facility]-EMERGENCY DEPT Provider Note   CSN: [id number] Arrival date & time: 01/28/17  1123     History   Chief Complaint Chief Complaint  Patient presents with  . Asthma    HPI Jaclyn Ruiz is a 46 y.o. female.  HPI   46 year old female with cough and torso breath. Onset last night and persistent since then. Cough is nonproductive. She has a past history of asthma and says that this feels like exacerbation. She often flares when the weather changes. No fever. No unusual swelling. No orthopnea.  Past Medical History:  Diagnosis Date  . Asthma   . Renal stones     There are no active problems to display for this patient.   Past Surgical History:  Procedure Laterality Date  . CESAREAN SECTION     times 2  . ENDOMETRIAL ABLATION  2008  . TUBAL LIGATION  1994   bilateral tubal ligation    OB History    No data available       Home Medications    Prior to Admission medications   Medication Sig Start Date End Date Taking? Authorizing Provider  acetaminophen (TYLENOL) 650 MG CR tablet Take 1,300 mg by mouth daily as needed for pain.    [provider]  benzonatate (TESSALON) 100 MG capsule Take 1 capsule (100 mg total) by mouth 3 (three) times daily as needed for cough. 01/28/17   Raeford Razor, MD  HYDROcodone-acetaminophen (NORCO/VICODIN) 5-325 MG per tablet Take 1-2 tablets by mouth every 6 (six) hours as needed for moderate pain or severe pain. After lithotripsy 07/22/14   Sebastian Ache, MD  ibuprofen (ADVIL,MOTRIN) 800 MG tablet Take 1 tablet (800 mg total) by mouth 3 (three) times daily. 07/13/14   Rancour, Jeannett Senior, MD  ondansetron (ZOFRAN ODT) 4 MG disintegrating tablet Take 1 tablet (4 mg total) by mouth every 8 (eight) hours as needed for nausea or vomiting. Patient not taking: Reported on 07/13/2014 06/26/14   Devoria Albe, MD  tamsulosin (FLOMAX) 0.4 MG CAPS capsule Take 1 po QD until you pass the stone. Patient not taking: Reported on 07/13/2014 06/26/14    Devoria Albe, MD  tamsulosin (FLOMAX) 0.4 MG CAPS capsule Take 0.4 mg by mouth every morning.    [provider]    Family History History reviewed. No pertinent family history.  Social History Social History  Substance Use Topics  . Smoking status: Never Smoker  . Smokeless tobacco: Not on file  . Alcohol use No     Allergies   Patient has no known allergies.   Review of Systems Review of Systems   Physical Exam Updated Vital Signs BP 120/64 (BP Location: Right Arm)   Pulse (!) 103   Temp 99.1 F (37.3 C) (Oral)   Resp 20   Ht  (1.6 m)   Wt 127.9 kg (282 lb)   SpO2 97%   BMI 49.95 kg/m   Physical Exam  Constitutional: She appears well-developed and well-nourished. No distress.  HENT:  Head: Normocephalic and atraumatic.  Eyes: Conjunctivae are normal. Right eye exhibits no discharge. Left eye exhibits no discharge.  Neck: Neck supple.  Cardiovascular: Normal rate, regular rhythm and normal heart sounds.  Exam reveals no gallop and no friction rub.   No murmur heard. Pulmonary/Chest: Effort normal. No respiratory distress. She has wheezes.  Mild respiratory wheezing. Able to speak in complete sentences.  Abdominal: Soft. She exhibits no distension. There is no tenderness.  Musculoskeletal: She exhibits no edema  or tenderness.  Lower extremities symmetric as compared to each other. No calf tenderness. Negative Homan's. No palpable cords.   Neurological: She is alert.  Skin: Skin is warm and dry.  Psychiatric: She has a normal mood and affect. Her behavior is normal. Thought content normal.  Nursing note and vitals reviewed.    ED Treatments / Results  Labs (all labs ordered are listed, but only abnormal results are displayed) Labs Reviewed - No data to display  EKG  EKG Interpretation None       Radiology Dg Chest 2 View  Result Date: 01/28/2017 CLINICAL DATA:  Two-day history of shortness of breath. EXAM: CHEST  2 VIEW  COMPARISON:  07/19/2016 FINDINGS: The heart size and mediastinal contours are within normal limits. Both lungs are clear. The visualized skeletal structures are unremarkable. IMPRESSION: No active cardiopulmonary disease. Electronically Signed   By: Kennith Center M.D.   On: 01/28/2017 12:35    Procedures Procedures (including critical care time)  Medications Ordered in ED Medications  albuterol (PROVENTIL HFA;VENTOLIN HFA) 108 (90 Base) MCG/ACT inhaler 2 puff (not administered)  dexamethasone (DECADRON) tablet 12 mg (not administered)  benzonatate (TESSALON) capsule 100 mg (not administered)  AEROCHAMBER PLUS FLO-VU LARGE MISC 1 each (not administered)  albuterol (PROVENTIL) (2.5 MG/3ML) 0.083% nebulizer solution 5 mg (5 mg Nebulization Given 01/28/17 1156)     Initial Impression / Assessment and Plan / ED Course  I have reviewed the triage vital signs and the nursing notes.  Pertinent labs & imaging results that were available during my care of the patient were reviewed by me and considered in my medical decision making (see chart for details).     46yF with dyspnea/cough. Wheezing on exam. No increased WOB. Not hypoxic. CXR w/o acute findings. No signs/symptoms of DVT. Doubt PE, atypical ACS or other emergent process. Will tx for bronchospasm/reactive airway disease. Decadron in ED. Given albuterol inhaler. PRN tesslon.   It has been determined that no acute conditions requiring further emergency intervention are present at this time. The patient has been advised of the diagnosis and plan. I reviewed any labs and imaging including any potential incidental findings. We have discussed signs and symptoms that warrant return to the ED and they are listed in the discharge instructions.    Final Clinical Impressions(s) / ED Diagnoses   Final diagnoses:  Acute bronchospasm    New Prescriptions New Prescriptions   BENZONATATE (TESSALON) 100 MG CAPSULE    Take 1 capsule (100 mg total)  by mouth 3 (three) times daily as needed for cough.     Raeford Razor, MD 01/30/17 1214

## 2017-01-28 NOTE — ED Notes (Signed)
Pt verbalized understanding of d/c instructions and has no further questions. VSS, NAD. Pt removed all belongings. Pt sent home with inhaler and spacer.

## 2017-05-19 ENCOUNTER — Other Ambulatory Visit: Payer: Self-pay

## 2017-05-19 ENCOUNTER — Emergency Department (HOSPITAL_COMMUNITY)
Admission: EM | Admit: 2017-05-19 | Discharge: 2017-05-20 | Disposition: A | Discharge status: Home / Self Care | Payer: Self-pay | Attending: Emergency Medicine | Admitting: Emergency Medicine

## 2017-05-19 ENCOUNTER — Encounter (HOSPITAL_COMMUNITY): Payer: Self-pay | Admitting: Emergency Medicine

## 2017-05-19 DIAGNOSIS — Z5321 Procedure and treatment not carried out due to patient leaving prior to being seen by health care provider: Secondary | ICD-10-CM | POA: Insufficient documentation

## 2017-05-19 DIAGNOSIS — Z202 Contact with and (suspected) exposure to infections with a predominantly sexual mode of transmission: Secondary | ICD-10-CM | POA: Insufficient documentation

## 2017-05-19 NOTE — ED Triage Notes (Signed)
Patient states she was told by one of her sexual partners that he had a STI.

## 2017-05-20 NOTE — ED Notes (Signed)
Not in WR when called 

## 2018-12-18 ENCOUNTER — Telehealth: Payer: Self-pay | Admitting: Family

## 2018-12-18 ENCOUNTER — Other Ambulatory Visit: Payer: Self-pay

## 2018-12-18 ENCOUNTER — Encounter: Payer: MEDICAID | Attending: Family | Admitting: Nutrition

## 2018-12-18 DIAGNOSIS — E118 Type 2 diabetes mellitus with unspecified complications: Secondary | ICD-10-CM | POA: Insufficient documentation

## 2018-12-18 DIAGNOSIS — IMO0002 Reserved for concepts with insufficient information to code with codable children: Secondary | ICD-10-CM

## 2018-12-18 DIAGNOSIS — E1165 Type 2 diabetes mellitus with hyperglycemia: Secondary | ICD-10-CM | POA: Insufficient documentation

## 2018-12-18 NOTE — Progress Notes (Signed)
  Medical Nutrition Therapy:  Appt start time: 1330 end time:  1430.  Assessment:  Primary concerns today: Diabetes Type 2. Lives with her duaghter and grandkids. Eats 2 meals. She and her daughter shop and coook.  Most foods are baked, boiled and fried. .Glipizide 5 mg once a day. Atorvastatin and Vit D. Taking it over thecounter. FBS 261 mg/dl. BS have been as high as 500's. Sees Rosealee Albee at [facility] in [place], [place]  Willing to make changes with diet .  Preferred Learning Style:     No preference indicated   Learning Readiness:    Ready  Change in progress   MEDICATIONS:    DiETARY INTAKE:   24-hr recall:  Eats 2-3 meals per day. Sometimes hungry, sometimes not.  Usual physical activity: ADL  Estimated energy needs: 1200  calories 135 g carbohydrates 90 g protein 33 g fat  Progress Towards Goal(s):  In progress.   Nutritional Diagnosis:  NB-1.1 Food and nutrition-related knowledge deficit As related to Diabetes Type 2.  As evidenced by A1C > 6.5%.    Intervention:  Nutrition and Diabetes education provided on My Plate, CHO counting, meal planning, portion sizes, timing of meals, avoiding snacks between meals unless having a low blood sugar, target ranges for A1C and blood sugars, signs/symptoms and treatment of hyper/hypoglycemia, monitoring blood sugars, taking medications as prescribed, benefits of exercising 30 minutes per day and prevention of complications of DM. Goals  FOllow My Plate  Eat three balanced meals per day  Cut out sodas, candy, cakes, sweets and junk food  Drink only water  Walk 30 minutes a day  Cut out snacks.  Test BS twice a day.  Call PCP if BS are over 300's.  Teaching Method Utilized:  Visual Auditory Hands on  Handouts given during visit include:  The Plate Method   Meal Plan   Barriers to learning/adherence to lifestyle change: none  Demonstrated degree of understanding via:  Teach Back    Monitoring/Evaluation:  Dietary intake, exercise, , and body weight in 1 month(s)

## 2019-01-14 ENCOUNTER — Encounter: Payer: Self-pay | Admitting: Nutrition

## 2019-01-14 NOTE — Patient Instructions (Signed)
Goals  FOllow My Plate  Eat three balanced meals per day  Cut out sodas, candy, cakes, sweets and junk food  Drink only water  Walk 30 minutes a day  Cut out snacks.  Test BS twice a day.  Call PCP if BS are over 300's.

## 2019-01-15 ENCOUNTER — Ambulatory Visit: Payer: Self-pay | Admitting: Nutrition

## 2019-02-09 ENCOUNTER — Ambulatory Visit: Payer: Self-pay | Admitting: Nutrition

## 2019-02-09 ENCOUNTER — Telehealth: Payer: Self-pay | Admitting: Nutrition

## 2019-02-09 NOTE — Telephone Encounter (Signed)
TC to reschedule missed appt. 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 BS are 200-300's. Sees her PCP next week. Only testing once a day. Suggested to test twice a day, increase lower carb vegetables, drink only water. Suggested meal times to eat:  2 pm,,8- pm and 12 am. Appt has been rescheduled.

## 2019-03-25 ENCOUNTER — Ambulatory Visit: Payer: Self-pay | Admitting: Nutrition

## 2019-08-24 ENCOUNTER — Telehealth: Payer: Self-pay | Admitting: Adult Health

## 2019-08-24 NOTE — Telephone Encounter (Signed)
Tried calling pt to remind of appt and restrictions. No voicemail.

## 2019-08-25 ENCOUNTER — Ambulatory Visit: Payer: Self-pay | Admitting: Adult Health

## 2019-08-25 ENCOUNTER — Encounter: Payer: Self-pay | Admitting: Adult Health

## 2019-08-25 VITALS — BP 135/95 | HR 103 | Ht 63.0 in | Wt 271.0 lb

## 2019-08-25 DIAGNOSIS — N390 Urinary tract infection, site not specified: Secondary | ICD-10-CM

## 2019-08-25 DIAGNOSIS — B369 Superficial mycosis, unspecified: Secondary | ICD-10-CM

## 2019-08-25 DIAGNOSIS — E1100 Type 2 diabetes mellitus with hyperosmolarity without nonketotic hyperglycemic-hyperosmolar coma (NKHHC): Secondary | ICD-10-CM

## 2019-08-25 DIAGNOSIS — N3946 Mixed incontinence: Secondary | ICD-10-CM | POA: Insufficient documentation

## 2019-08-25 DIAGNOSIS — R319 Hematuria, unspecified: Secondary | ICD-10-CM | POA: Insufficient documentation

## 2019-08-25 LAB — POCT URINALYSIS DIPSTICK
Glucose, UA: POSITIVE — AB
Ketones, UA: NEGATIVE
Nitrite, UA: POSITIVE
Protein, UA: NEGATIVE

## 2019-08-25 MED ORDER — GLIPIZIDE 5 MG PO TABS: 5.0000 mg | ORAL_TABLET | Freq: Every day | ORAL | 3 refills | Status: DC

## 2019-08-25 MED ORDER — FLUCONAZOLE 100 MG PO TABS: ORAL_TABLET | ORAL | 1 refills | Status: DC

## 2019-08-25 MED ORDER — SULFAMETHOXAZOLE-TRIMETHOPRIM 800-160 MG PO TABS: 1.0000 | ORAL_TABLET | Freq: Two times a day (BID) | ORAL | 0 refills | Status: DC

## 2019-08-25 NOTE — Progress Notes (Signed)
Patient ID: Jaclyn Ruiz, female   DOB: 1971/02/09, 49 y.o.   MRN: [id number] History of Present Illness: Jaclyn Ruiz is a 49 year old white female, in complaining of rash and pain in groin area, pressure with voiding, has UI. She is off all meds, is diabetic.    Current Medications, Allergies, Past Medical History, Past Surgical History, Family History and Social History were reviewed in [employer] record.     Review of Systems: Has pressure with voiding Has rash and pain in groin area for over a month, seen at [facility] and treated in April, no better Has UI Is off BP all meds     Physical Exam:BP (!) 135/95 (BP Location: Left Arm, Patient Position: Sitting, Cuff Size: Normal)   Pulse (!) 103   Ht 5\' 3"  (1.6 m)   Wt 271 lb (122.9 kg)   BMI 48.01 kg/m urine dipstick trace blood,4+glucose +nitrates and trace leuks General:  Well developed, well nourished, no acute distress Skin:  Warm and dry Neck:  Midline trachea, normal thyroid, good ROM, no lymphadenopathy Lungs; Clear to auscultation bilaterally Cardiovascular: Regular rate and rhythm Pelvic:  External genitalia red and swollen and scaling, under panniculus and in groin and labia to rectal area and increases on thighs, painted with gentian violet.  The vagina is normal in appearance. Urethra has no lesions or masses. The cervix is bulbous.  Uterus is felt to be normal size, shape, and contour.  No adnexal masses or tenderness noted.Bladder is non tender, no masses felt. Psych:  No mood changes, alert and cooperative,seems happy AA 0 Fall risk is low PHQ 9 score is 2. Examination chaperoned by LPN and co exam with Malachy Mood NP student  Impression and Plan:   1. Hematuria, unspecified type UA C&S sent  2. 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 glipiZIDE (GLUCOTROL) 5 MG  tablet    Sig: Take 1 tablet (5 mg total) by mouth daily before breakfast.    Dispense:  30 tablet    Refill:  3    Order Specific Question:   Supervising Provider    Answer:   Richelle Ito, LUTHER H [[id number]]  . fluconazole (DIFLUCAN) 100 MG tablet    Sig: Take 1 daily for 10 days    Dispense:  10 tablet    Refill:  1    Order Specific Question:   Supervising Provider    Answer:   Despina Hidden, LUTHER H [[id number]]  . sulfamethoxazole-trimethoprim (BACTRIM DS) 800-160 MG tablet    Sig: Take 1 tablet by mouth 2 (two) times daily. Take 1 bid    Dispense:  14 tablet    Refill:  0    Order Specific Question:   Supervising Provider    Answer:   Despina Hidden, LUTHER H [[id number]]     3. 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 Poorly controlled type 2 diabetes mellitus with hyperosmolarity (HCC) Will refill glipizide Check Blood sugars at home  Call PCP,needs other meds

## 2019-08-26 LAB — URINALYSIS, ROUTINE W REFLEX MICROSCOPIC
Bilirubin, UA: NEGATIVE
Ketones, UA: NEGATIVE
Nitrite, UA: NEGATIVE
Protein,UA: NEGATIVE
RBC, UA: NEGATIVE
Specific Gravity, UA: >=1.03 — AB (ref 1.005–1.030)
Urobilinogen, Ur: 0.2 mg/dL (ref 0.2–1.0)
pH, UA: 6 (ref 5.0–7.5)

## 2019-08-26 LAB — MICROSCOPIC EXAMINATION
Casts: NONE SEEN /lpf
Epithelial Cells (non renal): NONE SEEN /hpf (ref 0–10)
RBC: NONE SEEN /hpf (ref 0–2)
WBC, UA: >30 /hpf — AB (ref 0–5)

## 2019-08-28 ENCOUNTER — Telehealth: Payer: Self-pay | Admitting: *Deleted

## 2019-08-28 ENCOUNTER — Telehealth: Payer: Self-pay | Admitting: Adult Health

## 2019-08-28 LAB — URINE CULTURE

## 2019-08-28 LAB — NUSWAB VAGINITIS PLUS (VG+)
Candida albicans, NAA: POSITIVE — AB
Candida glabrata, NAA: NEGATIVE
Chlamydia trachomatis, NAA: NEGATIVE
Neisseria gonorrhoeae, NAA: NEGATIVE
Trich vag by NAA: NEGATIVE

## 2019-08-28 NOTE — Telephone Encounter (Signed)

## 2019-08-28 NOTE — Telephone Encounter (Signed)
-----   Message from Adline Potter, NP sent at 08/28/2019  8:50 AM EDT ----- Let her know just yeast on nuswab

## 2019-08-28 NOTE — Telephone Encounter (Signed)
Pt notified by phone that yeast showed up on swab. Pt voiced understanding. JSY

## 2019-08-31 ENCOUNTER — Encounter: Payer: Self-pay | Admitting: Adult Health

## 2019-08-31 ENCOUNTER — Ambulatory Visit (INDEPENDENT_AMBULATORY_CARE_PROVIDER_SITE_OTHER): Payer: Self-pay | Admitting: Adult Health

## 2019-08-31 VITALS — BP 152/92 | HR 110 | Ht 63.0 in | Wt 273.6 lb

## 2019-08-31 DIAGNOSIS — E1100 Type 2 diabetes mellitus with hyperosmolarity without nonketotic hyperglycemic-hyperosmolar coma (NKHHC): Secondary | ICD-10-CM

## 2019-08-31 DIAGNOSIS — B009 Herpesviral infection, unspecified: Secondary | ICD-10-CM

## 2019-08-31 DIAGNOSIS — B369 Superficial mycosis, unspecified: Secondary | ICD-10-CM

## 2019-08-31 MED ORDER — VALACYCLOVIR HCL 1 G PO TABS: 1000.0000 mg | ORAL_TABLET | Freq: Two times a day (BID) | ORAL | 1 refills | Status: DC

## 2019-08-31 NOTE — Progress Notes (Signed)
  Subjective:     Patient ID: Jaclyn Ruiz, female   DOB: 05-26-1970, 49 y.o.   MRN: [id number]  HPI Jaclyn Ruiz is a 49 year old white female, back in follow up on being treated for UTI and extensive skin yeast.She is taking diabetic meds. She says she feels better and is walking better.  PCP is Jaclyn Odea NP  Review of Systems Less red, rash better, still has pain between buttocks Reviewed past medical,surgical, social and family history. Reviewed medications and allergies.     Objective:   Physical Exam BP (!) 152/92 (BP Location: Right Arm, Patient Position: Sitting, Cuff Size: Normal)   Pulse (!) 110   Ht 5\' 3"  (1.6 m)   Wt 273 lb 9.6 oz (124.1 kg)   BMI 48.47 kg/m  Skin warm and dry.Pelvic: external genitalia is less red and swollen, painted with gentian violet again, painted under panniculus and in in creases os thighs and behind her knees.Has vesicles, ulcerations inner buttocks, herpes culture obtained, these were not sen last visit 5/11. Examination chaperoned by 7/11 LPN    Assessment:     1. Infection of skin and subcutaneous tissue due to fungus Painted with gentian violet, keep dry Finish diflucan   2. Poorly controlled type 2 diabetes mellitus with hyperosmolarity (HCC) Check sugars at home Keep appt with PCP for 09/28/19  3. Herpes infection HSV culture sent Will rx valtrex Meds ordered this encounter  Medications  . valACYclovir (VALTREX) 1000 MG tablet    Sig: Take 1 tablet (1,000 mg total) by mouth 2 (two) times daily.    Dispense:  20 tablet    Refill:  1    Order Specific Question:   Supervising Provider    Answer:   09/30/19 [[id number]]      Plan:     Follow up in 4 days

## 2019-09-02 LAB — HERPES SIMPLEX VIRUS CULTURE

## 2019-09-03 ENCOUNTER — Telehealth: Payer: Self-pay | Admitting: Adult Health

## 2019-09-03 NOTE — Telephone Encounter (Signed)

## 2019-09-04 ENCOUNTER — Encounter: Payer: Self-pay | Admitting: Adult Health

## 2019-09-04 ENCOUNTER — Ambulatory Visit: Payer: Self-pay | Admitting: Adult Health

## 2019-09-04 VITALS — BP 129/95 | HR 89 | Ht 63.0 in | Wt 276.0 lb

## 2019-09-04 DIAGNOSIS — B369 Superficial mycosis, unspecified: Secondary | ICD-10-CM

## 2019-09-04 DIAGNOSIS — B009 Herpesviral infection, unspecified: Secondary | ICD-10-CM

## 2019-09-04 DIAGNOSIS — E1100 Type 2 diabetes mellitus with hyperosmolarity without nonketotic hyperglycemic-hyperosmolar coma (NKHHC): Secondary | ICD-10-CM

## 2019-09-04 NOTE — Progress Notes (Signed)
  Subjective:     Patient ID: Jaclyn Ruiz, female   DOB: Nov 21, 1970, 49 y.o.   MRN: [id number]  HPI Jaclyn Ruiz is a 49 year old white female, single, [id number], back in follow up on extensive yeast and herpes and feels much better, still has some itching.  Review of Systems Still itching some but feels much better Reviewed past medical,surgical, social and family history. Reviewed medications and allergies.     Objective:   Physical Exam BP (!) 129/95 (BP Location: Left Arm, Patient Position: Sitting, Cuff Size: Normal)   Pulse 89   Ht 5\' 3"  (1.6 m)   Wt 276 lb (125.2 kg)   BMI 48.89 kg/m    Skin is warm and dry, the labia is less red and less swollen, scaly now, and vesicles have resolved, was +HSV 2, painted with gentian violet again.The areas behind knees almost resolved.  Examination chaperoned by LPN  Assessment:     1. 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 Herpes infection Finish valtrex  3. Poorly controlled type 2 diabetes mellitus with hyperosmolarity (HCC) Take glipizide and see PCP as Scheduled in June    Plan:    Keep areas dry Follow up June 8th

## 2019-09-22 ENCOUNTER — Ambulatory Visit: Payer: Self-pay | Admitting: Adult Health

## 2019-09-25 ENCOUNTER — Telehealth: Payer: Self-pay | Admitting: Adult Health

## 2019-09-25 NOTE — Telephone Encounter (Signed)
Called patient regarding appointment and the following message was left:   Updated visitor policy: We are now allowing one support person with you during your upcoming visit.  However, we do ask that they wear a mask and will also be screened at check-in.   We ask if you are sick, have any symptoms of COVID, have had any exposure to anyone suspected or confirmed of having COVID-19, or are awaiting test results for COVID-19, to call our office as we may need to reschedule you for a virtual visit or schedule your appointment for a later date.    Please know we will ask you these questions or similar questions when you arrive for your appointment and understand this is how we are keeping everyone safe.    Also,to keep you safe, please use the provided hand sanitizer when you enter the office. We are asking everyone in the office to wear a mask to help prevent the spread of germs. If you have a mask of your own, please wear it to your appointment, if not, we are happy to provide one for you.  Thank you for understanding and your cooperation.    [facility] Staff      

## 2019-09-28 ENCOUNTER — Ambulatory Visit: Payer: Self-pay | Admitting: Adult Health

## 2019-12-10 ENCOUNTER — Ambulatory Visit: Payer: Self-pay | Admitting: Physician Assistant

## 2019-12-15 ENCOUNTER — Ambulatory Visit: Payer: Self-pay | Admitting: Physician Assistant

## 2019-12-15 ENCOUNTER — Encounter: Payer: Self-pay | Admitting: Physician Assistant

## 2019-12-15 DIAGNOSIS — Z5320 Procedure and treatment not carried out because of patient's decision for unspecified reasons: Secondary | ICD-10-CM

## 2019-12-15 DIAGNOSIS — Z7689 Persons encountering health services in other specified circumstances: Secondary | ICD-10-CM

## 2019-12-15 DIAGNOSIS — Z8639 Personal history of other endocrine, nutritional and metabolic disease: Secondary | ICD-10-CM

## 2019-12-15 DIAGNOSIS — E1165 Type 2 diabetes mellitus with hyperglycemia: Secondary | ICD-10-CM

## 2019-12-15 DIAGNOSIS — Z8679 Personal history of other diseases of the circulatory system: Secondary | ICD-10-CM

## 2019-12-15 DIAGNOSIS — E669 Obesity, unspecified: Secondary | ICD-10-CM

## 2019-12-15 MED ORDER — GLIPIZIDE 5 MG PO TABS: 5.0000 mg | ORAL_TABLET | Freq: Two times a day (BID) | ORAL | 0 refills | Status: DC

## 2019-12-15 NOTE — Patient Instructions (Addendum)
Fasting Labs next week  (Monday-Friday 8am- 4pm)    Recommend covid vaccination  ([facility] or [facility])

## 2019-12-15 NOTE — Progress Notes (Signed)
There were no vitals taken for this visit.   Subjective:    Patient ID: Jaclyn Ruiz, female    DOB: 1970/09/21, 49 y.o.   MRN: [id number]  HPI: Jaclyn Ruiz is a 49 y.o. female presenting on 12/15/2019 for No chief complaint on file.   HPI   Pt was scheduled to come into office for in-person appointment but due to her being sick and refusing recommended covid testing when seen in ER on 12/11/19, she was changed to virtual appointment through [employer].  I connected with  [name] on 12/15/2019 by a video enabled telemedicine application and verified that I am speaking with the correct person using two identifiers.   I discussed the limitations of evaluation and management by telemedicine. The patient expressed understanding and agreed to proceed.  Pt is in her parked car in front of office.   Provider is inside office.    Pt is 49yoF who was Previously at [facility].  She says she was Last seen there last year sometime.  She says they kept changing her appointments and wouldn't let her know so she quit going there.     She works at [employer] through a [employer]  She says she has Never had a mammogram  She Doesn't remember her last PAP, maybe 5 or 6 years ago.  She Has not gotten covid vaccination.  She says her last a1c was "11 or 17 or something like that".  She checks her bs at home and says fbs 260 usually.    She was on medication for BP and high cholesterol in the past  She was on metformin in the past but stopped it when it was recalled.  Notes from ER on 12/11/19 reviewed- RST negative, covid test refused.         Relevant past medical, surgical, family and social history reviewed and updated as indicated. Interim medical history since our last visit reviewed. Allergies and medications reviewed and updated.    Current Outpatient Medications:    acetaminophen (TYLENOL) 650 MG CR tablet, Take 1,300 mg by mouth daily as needed for  pain., Disp: , Rfl:    glipiZIDE (GLUCOTROL) 5 MG tablet, Take 1 tablet (5 mg total) by mouth daily before breakfast., Disp: 30 tablet, Rfl: 3      Review of Systems  Per HPI unless specifically indicated above     Objective:    There were no vitals taken for this visit.  Wt Readings from Last 3 Encounters:  09/04/19 276 lb (125.2 kg)  08/31/19 273 lb 9.6 oz (124.1 kg)  08/25/19 271 lb (122.9 kg)    Physical Exam Constitutional:      General: She is not in acute distress. HENT:     Head: Normocephalic and atraumatic.     Mouth/Throat:     Dentition: Abnormal dentition.     Comments: Many teeth missing.  Some teeth remaining are dark. Pulmonary:     Effort: No respiratory distress.  Neurological:     Mental Status: She is alert and oriented to person, place, and time.  Psychiatric:        Attention and Perception: Attention normal.        Behavior: Behavior is cooperative.             Assessment & Plan:     Encounter Diagnoses  Name Primary?   Encounter to establish care Yes   Uncontrolled type 2 diabetes mellitus with  hyperglycemia (HCC)    Obesity, unspecified classification, unspecified obesity type, unspecified whether serious comorbidity present    History of hyperlipidemia    History of hypertension    Screening mammography declined      -will get baseline Labs.  Pt instructed to wait until next week (due to unknown covid status) -will Increase glipizide in light of continuous elevated fbs over 200 -pt Declined screening mammogram -counseled pt on covid vaccination and encouraged her to get it  -pt to follow up in office  3-4 wk.  She is to contact office sooner prn

## 2019-12-25 ENCOUNTER — Other Ambulatory Visit (HOSPITAL_COMMUNITY)
Admission: RE | Admit: 2019-12-25 | Discharge: 2019-12-25 | Disposition: A | Discharge status: Home / Self Care | Payer: Self-pay | Source: Ambulatory Visit | Attending: Physician Assistant | Admitting: Physician Assistant

## 2019-12-25 ENCOUNTER — Other Ambulatory Visit: Payer: Self-pay

## 2019-12-25 DIAGNOSIS — E1165 Type 2 diabetes mellitus with hyperglycemia: Secondary | ICD-10-CM | POA: Insufficient documentation

## 2019-12-25 DIAGNOSIS — Z8639 Personal history of other endocrine, nutritional and metabolic disease: Secondary | ICD-10-CM | POA: Insufficient documentation

## 2019-12-25 DIAGNOSIS — Z8679 Personal history of other diseases of the circulatory system: Secondary | ICD-10-CM | POA: Insufficient documentation

## 2019-12-25 LAB — COMPREHENSIVE METABOLIC PANEL
ALT: 35 U/L (ref 0–44)
AST: 29 U/L (ref 15–41)
Albumin: 3.4 g/dL — ABNORMAL LOW (ref 3.5–5.0)
Alkaline Phosphatase: 79 U/L (ref 38–126)
Anion gap: 8 (ref 5–15)
BUN: 12 mg/dL (ref 6–20)
CO2: 28 mmol/L (ref 22–32)
Calcium: 8.9 mg/dL (ref 8.9–10.3)
Chloride: 97 mmol/L — ABNORMAL LOW (ref 98–111)
Creatinine, Ser: 0.86 mg/dL (ref 0.44–1.00)
GFR calc Af Amer: >60 mL/min (ref >60)
GFR calc non Af Amer: >60 mL/min (ref >60)
Glucose, Bld: 254 mg/dL — ABNORMAL HIGH (ref 70–99)
Potassium: 3.7 mmol/L (ref 3.5–5.1)
Sodium: 133 mmol/L — ABNORMAL LOW (ref 135–145)
Total Bilirubin: 0.9 mg/dL (ref 0.3–1.2)
Total Protein: 7.9 g/dL (ref 6.5–8.1)

## 2019-12-25 LAB — HEMOGLOBIN A1C
Hgb A1c MFr Bld: 9.3 % — ABNORMAL HIGH (ref 4.8–5.6)
Mean Plasma Glucose: 220.21 mg/dL

## 2019-12-25 LAB — LIPID PANEL
Cholesterol: 258 mg/dL — ABNORMAL HIGH (ref 0–200)
HDL: 45 mg/dL (ref >40)
LDL Cholesterol: 160 mg/dL — ABNORMAL HIGH (ref 0–99)
Total CHOL/HDL Ratio: 5.7 RATIO
Triglycerides: 264 mg/dL — ABNORMAL HIGH (ref <150)
VLDL: 53 mg/dL — ABNORMAL HIGH (ref 0–40)

## 2019-12-26 LAB — MICROALBUMIN, URINE: Microalb, Ur: 17.5 ug/mL — ABNORMAL HIGH

## 2020-01-07 ENCOUNTER — Encounter: Payer: Self-pay | Admitting: Physician Assistant

## 2020-01-07 ENCOUNTER — Other Ambulatory Visit: Payer: Self-pay

## 2020-01-07 ENCOUNTER — Ambulatory Visit: Payer: Self-pay | Admitting: Physician Assistant

## 2020-01-07 VITALS — BP 100/72 | HR 93 | Temp 97.7°F | Ht 63.0 in | Wt 270.2 lb

## 2020-01-07 DIAGNOSIS — E785 Hyperlipidemia, unspecified: Secondary | ICD-10-CM

## 2020-01-07 DIAGNOSIS — J45909 Unspecified asthma, uncomplicated: Secondary | ICD-10-CM

## 2020-01-07 DIAGNOSIS — E1165 Type 2 diabetes mellitus with hyperglycemia: Secondary | ICD-10-CM

## 2020-01-07 DIAGNOSIS — Z5320 Procedure and treatment not carried out because of patient's decision for unspecified reasons: Secondary | ICD-10-CM

## 2020-01-07 MED ORDER — ATORVASTATIN CALCIUM 20 MG PO TABS: 20.0000 mg | ORAL_TABLET | Freq: Every day | ORAL | 0 refills | Status: DC

## 2020-01-07 MED ORDER — GLIPIZIDE 5 MG PO TABS
5.0000 mg | ORAL_TABLET | Freq: Two times a day (BID) | ORAL | 0 refills | Status: DC
Start: 2020-01-07 — End: 2020-02-11

## 2020-01-07 MED ORDER — METFORMIN HCL 1000 MG PO TABS: 1000.0000 mg | ORAL_TABLET | Freq: Two times a day (BID) | ORAL | 0 refills | Status: DC

## 2020-01-07 MED ORDER — ALBUTEROL SULFATE HFA 108 (90 BASE) MCG/ACT IN AERS: 2.0000 | INHALATION_SPRAY | Freq: Four times a day (QID) | RESPIRATORY_TRACT | 0 refills | Status: DC | PRN

## 2020-01-07 NOTE — Progress Notes (Signed)
BP 100/72    Pulse 93    Temp 97.7 F (36.5 C)    Ht 5\' 3"  (1.6 m)    Wt 270 lb 3.2 oz (122.6 kg)    BMI 47.86 kg/m    Subjective:    Patient ID: Jaclyn Ruiz, female    DOB: 08-26-1970, 49 y.o.   MRN: [id number]  HPI: Jaclyn Ruiz is a 49 y.o. female presenting on 01/07/2020 for Follow-up   HPI   Pt had a negative covid 19 screening questionnaire.   Pt is a 49yoF who presents for follow up after her new patient appointment 1 month ago.    She says she is using her inhaler once or twice/day.  She says her asthma is flared up.  She still never got covid tested after recommendations at last appt.  She has not gotten vaccinated.  She says her asthma flare-ups usually come 3 or 4 times/year.  She says they usually only last 3 or 4 days.      Relevant past medical, surgical, family and social history reviewed and updated as indicated. Interim medical history since our last visit reviewed. Allergies and medications reviewed and updated.   Current Outpatient Medications:    acetaminophen (TYLENOL) 650 MG CR tablet, Take 1,300 mg by mouth daily as needed for pain., Disp: , Rfl:    albuterol (VENTOLIN HFA) 108 (90 Base) MCG/ACT inhaler, Inhale into the lungs every 6 (six) hours as needed for wheezing or shortness of breath., Disp: , Rfl:    glipiZIDE (GLUCOTROL) 5 MG tablet, Take 1 tablet (5 mg total) by mouth 2 (two) times daily before a meal., Disp: 60 tablet, Rfl: 0     Review of Systems  Per HPI unless specifically indicated above     Objective:    BP 100/72    Pulse 93    Temp 97.7 F (36.5 C)    Ht 5\' 3"  (1.6 m)    Wt 270 lb 3.2 oz (122.6 kg)    BMI 47.86 kg/m   Wt Readings from Last 3 Encounters:  01/07/20 270 lb 3.2 oz (122.6 kg)  09/04/19 276 lb (125.2 kg)  08/31/19 273 lb 9.6 oz (124.1 kg)     (pt is wearing acrylic nails with polish so unable to obtain oxygen saturation reading)   Physical Exam HENT:     Head: Normocephalic and atraumatic.   Cardiovascular:     Rate and Rhythm: Normal rate and regular rhythm.  Pulmonary:     Effort: Pulmonary effort is normal. No tachypnea, bradypnea, accessory muscle usage or respiratory distress.     Breath sounds: No stridor. No wheezing, rhonchi or rales.  Neurological:     Mental Status: She is alert and oriented to person, place, and time.  Psychiatric:        Attention and Perception: Attention normal.        Speech: Speech normal.     Results for orders placed or performed during the hospital encounter of 12/25/19  Lipid panel  Result Value Ref Range   Cholesterol 258 (H) 0 - 200 mg/dL   Triglycerides 09/02/19 (H) <150 mg/dL   HDL 45 02/24/20 mg/dL   Total CHOL/HDL Ratio 5.7 RATIO   VLDL 53 (H) 0 - 40 mg/dL   LDL Cholesterol 284 (H) 0 - 99 mg/dL  Microalbumin, urine  Result Value Ref Range   Microalb, Ur 17.5 (H) Not Estab. ug/mL  Hemoglobin A1c  Result Value Ref Range  Hgb A1c MFr Bld 9.3 (H) 4.8 - 5.6 %   Mean Plasma Glucose 220.21 mg/dL  Comprehensive metabolic panel  Result Value Ref Range   Sodium 133 (L) 135 - 145 mmol/L   Potassium 3.7 3.5 - 5.1 mmol/L   Chloride 97 (L) 98 - 111 mmol/L   CO2 28 22 - 32 mmol/L   Glucose, Bld 254 (H) 70 - 99 mg/dL   BUN 12 6 - 20 mg/dL   Creatinine, Ser 4.25 0.44 - 1.00 mg/dL   Calcium 8.9 8.9 - 95.6 mg/dL   Total Protein 7.9 6.5 - 8.1 g/dL   Albumin 3.4 (L) 3.5 - 5.0 g/dL   AST 29 15 - 41 U/L   ALT 35 0 - 44 U/L   Alkaline Phosphatase 79 38 - 126 U/L   Total Bilirubin 0.9 0.3 - 1.2 mg/dL   GFR calc non Af Amer >60 >60 mL/min   GFR calc Af Amer >60 >60 mL/min   Anion gap 8 5 - 15      Assessment & Plan:    Encounter Diagnoses  Name Primary?   Uncontrolled type 2 diabetes mellitus with hyperglycemia (HCC) Yes   Hyperlipidemia, unspecified hyperlipidemia type    Uncomplicated asthma, unspecified asthma severity, unspecified whether persistent    Morbid obesity (HCC)    Screening mammography declined      -reviewed  labs with pt -will Restart atorvastatin -will Restart metformin.  Continue glipizide. -counseled and recommended covid vaccination -pt Declined screening mammogram -pt to follow up 3 months.  She is to contact office sooner prn

## 2020-02-11 ENCOUNTER — Other Ambulatory Visit: Payer: Self-pay | Admitting: Physician Assistant

## 2020-02-11 MED ORDER — GLIPIZIDE 5 MG PO TABS
5.0000 mg | ORAL_TABLET | Freq: Two times a day (BID) | ORAL | 0 refills | Status: DC
Start: 2020-02-11 — End: 2020-05-24

## 2020-02-11 MED ORDER — METFORMIN HCL 1000 MG PO TABS
1000.0000 mg | ORAL_TABLET | Freq: Two times a day (BID) | ORAL | 0 refills | Status: DC
Start: 2020-02-11 — End: 2020-05-24

## 2020-02-11 MED ORDER — ATORVASTATIN CALCIUM 20 MG PO TABS
20.0000 mg | ORAL_TABLET | Freq: Every day | ORAL | 0 refills | Status: DC
Start: 2020-02-11 — End: 2020-05-24

## 2020-04-16 DEATH — deceased

## 2020-04-27 ENCOUNTER — Ambulatory Visit: Payer: Self-pay | Admitting: Physician Assistant

## 2020-05-23 ENCOUNTER — Other Ambulatory Visit: Payer: Self-pay

## 2020-05-23 ENCOUNTER — Other Ambulatory Visit (HOSPITAL_COMMUNITY)
Admission: RE | Admit: 2020-05-23 | Discharge: 2020-05-23 | Disposition: A | Discharge status: Home / Self Care | Payer: Self-pay | Source: Ambulatory Visit | Attending: Physician Assistant | Admitting: Physician Assistant

## 2020-05-23 DIAGNOSIS — E785 Hyperlipidemia, unspecified: Secondary | ICD-10-CM | POA: Insufficient documentation

## 2020-05-23 DIAGNOSIS — E1165 Type 2 diabetes mellitus with hyperglycemia: Secondary | ICD-10-CM | POA: Insufficient documentation

## 2020-05-23 LAB — COMPREHENSIVE METABOLIC PANEL
ALT: 25 U/L (ref 0–44)
AST: 27 U/L (ref 15–41)
Albumin: 3.2 g/dL — ABNORMAL LOW (ref 3.5–5.0)
Alkaline Phosphatase: 85 U/L (ref 38–126)
Anion gap: 8 (ref 5–15)
BUN: 12 mg/dL (ref 6–20)
CO2: 26 mmol/L (ref 22–32)
Calcium: 9.1 mg/dL (ref 8.9–10.3)
Chloride: 99 mmol/L (ref 98–111)
Creatinine, Ser: 0.89 mg/dL (ref 0.44–1.00)
GFR, Estimated: >60 mL/min (ref >60)
Glucose, Bld: 147 mg/dL — ABNORMAL HIGH (ref 70–99)
Potassium: 4.1 mmol/L (ref 3.5–5.1)
Sodium: 133 mmol/L — ABNORMAL LOW (ref 135–145)
Total Bilirubin: 0.8 mg/dL (ref 0.3–1.2)
Total Protein: 8 g/dL (ref 6.5–8.1)

## 2020-05-23 LAB — HEMOGLOBIN A1C
Hgb A1c MFr Bld: 7 % — ABNORMAL HIGH (ref 4.8–5.6)
Mean Plasma Glucose: 154.2 mg/dL

## 2020-05-23 LAB — LIPID PANEL
Cholesterol: 119 mg/dL (ref 0–200)
HDL: 39 mg/dL — ABNORMAL LOW (ref >40)
LDL Cholesterol: 51 mg/dL (ref 0–99)
Total CHOL/HDL Ratio: 3.1 RATIO
Triglycerides: 143 mg/dL (ref <150)
VLDL: 29 mg/dL (ref 0–40)

## 2020-05-24 ENCOUNTER — Encounter: Payer: Self-pay | Admitting: Physician Assistant

## 2020-05-24 ENCOUNTER — Ambulatory Visit: Payer: Self-pay | Admitting: Physician Assistant

## 2020-05-24 ENCOUNTER — Other Ambulatory Visit: Payer: Self-pay

## 2020-05-24 VITALS — BP 126/84 | HR 77 | Temp 97.3°F | Ht 63.0 in | Wt 265.0 lb

## 2020-05-24 DIAGNOSIS — E119 Type 2 diabetes mellitus without complications: Secondary | ICD-10-CM

## 2020-05-24 DIAGNOSIS — E785 Hyperlipidemia, unspecified: Secondary | ICD-10-CM

## 2020-05-24 DIAGNOSIS — Z5320 Procedure and treatment not carried out because of patient's decision for unspecified reasons: Secondary | ICD-10-CM

## 2020-05-24 DIAGNOSIS — J45909 Unspecified asthma, uncomplicated: Secondary | ICD-10-CM

## 2020-05-24 MED ORDER — GLIPIZIDE 5 MG PO TABS
5.0000 mg | ORAL_TABLET | Freq: Two times a day (BID) | ORAL | 1 refills | Status: DC
Start: 2020-05-24 — End: 2020-12-05

## 2020-05-24 MED ORDER — ATORVASTATIN CALCIUM 20 MG PO TABS
20.0000 mg | ORAL_TABLET | Freq: Every day | ORAL | 1 refills | Status: DC
Start: 2020-05-24 — End: 2020-12-05

## 2020-05-24 MED ORDER — METFORMIN HCL 1000 MG PO TABS
1000.0000 mg | ORAL_TABLET | Freq: Two times a day (BID) | ORAL | 1 refills | Status: DC
Start: 2020-05-24 — End: 2020-12-05

## 2020-05-24 NOTE — Progress Notes (Signed)
BP 126/84   Pulse 77   Temp (!) 97.3 F (36.3 C)   Ht 5\' 3"  (1.6 m)   Wt 265 lb (120.2 kg)   SpO2 95%   BMI 46.94 kg/m    Subjective:    Patient ID: Jaclyn Ruiz, female    DOB: 1970-04-21, 50 y.o.   MRN: [id number]  HPI: Jaclyn Ruiz is a 50 y.o. female presenting on 05/24/2020 for No chief complaint on file.   HPI  Pt had a negative covid 19 screening questionnaire.   Pt is 49yoF wth DM, dyslipidemia and asthma. She is breathing good.  She says she is using her Inhaler < 1 /wk She is Not vaccinated for covid and says she isn't going to She has no complaints today     Relevant past medical, surgical, family and social history reviewed and updated as indicated. Interim medical history since our last visit reviewed. Allergies and medications reviewed and updated.   Current Outpatient Medications:  .  acetaminophen (TYLENOL) 650 MG CR tablet, Take 1,300 mg by mouth daily as needed for pain., Disp: , Rfl:  .  albuterol (VENTOLIN HFA) 108 (90 Base) MCG/ACT inhaler, Inhale 2 puffs into the lungs every 6 (six) hours as needed for wheezing or shortness of breath., Disp: 3 each, Rfl: 0 .  atorvastatin (LIPITOR) 20 MG tablet, Take 1 tablet (20 mg total) by mouth daily., Disp: 30 tablet, Rfl: 0 .  glipiZIDE (GLUCOTROL) 5 MG tablet, Take 1 tablet (5 mg total) by mouth 2 (two) times daily before a meal., Disp: 60 tablet, Rfl: 0 .  metFORMIN (GLUCOPHAGE) 1000 MG tablet, Take 1 tablet (1,000 mg total) by mouth 2 (two) times daily with a meal., Disp: 60 tablet, Rfl: 0    Review of Systems  Per HPI unless specifically indicated above     Objective:    BP 126/84   Pulse 77   Temp (!) 97.3 F (36.3 C)   Ht 5\' 3"  (1.6 m)   Wt 265 lb (120.2 kg)   SpO2 95%   BMI 46.94 kg/m   Wt Readings from Last 3 Encounters:  05/24/20 265 lb (120.2 kg)  01/07/20 270 lb 3.2 oz (122.6 kg)  09/04/19 276 lb (125.2 kg)    Physical Exam Vitals reviewed.  Constitutional:      General: She  is not in acute distress.    Appearance: She is well-developed and well-nourished. She is obese. She is not ill-appearing.  HENT:     Head: Normocephalic and atraumatic.  Cardiovascular:     Rate and Rhythm: Normal rate and regular rhythm.  Pulmonary:     Effort: Pulmonary effort is normal.     Breath sounds: Normal breath sounds.  Abdominal:     General: Bowel sounds are normal.     Palpations: Abdomen is soft. There is no hepatosplenomegaly or mass.     Tenderness: There is no abdominal tenderness.  Musculoskeletal:        General: No edema.     Cervical back: Neck supple.     Right lower leg: No edema.     Left lower leg: No edema.  Lymphadenopathy:     Cervical: No cervical adenopathy.  Skin:    General: Skin is warm and dry.  Neurological:     Mental Status: She is alert and oriented to person, place, and time.  Psychiatric:        Attention and Perception: Attention normal.  Mood and Affect: Mood and affect normal.        Speech: Speech normal.        Behavior: Behavior normal.         Results for orders placed or performed during the hospital encounter of 05/23/20  Hemoglobin A1c  Result Value Ref Range   Hgb A1c MFr Bld 7.0 (H) 4.8 - 5.6 %   Mean Plasma Glucose 154.2 mg/dL  Lipid panel  Result Value Ref Range   Cholesterol 119 0 - 200 mg/dL   Triglycerides 035 <597 mg/dL   HDL 39 (L) >41 mg/dL   Total CHOL/HDL Ratio 3.1 RATIO   VLDL 29 0 - 40 mg/dL   LDL Cholesterol 51 0 - 99 mg/dL  Comprehensive metabolic panel  Result Value Ref Range   Sodium 133 (L) 135 - 145 mmol/L   Potassium 4.1 3.5 - 5.1 mmol/L   Chloride 99 98 - 111 mmol/L   CO2 26 22 - 32 mmol/L   Glucose, Bld 147 (H) 70 - 99 mg/dL   BUN 12 6 - 20 mg/dL   Creatinine, Ser 6.38 0.44 - 1.00 mg/dL   Calcium 9.1 8.9 - 45.3 mg/dL   Total Protein 8.0 6.5 - 8.1 g/dL   Albumin 3.2 (L) 3.5 - 5.0 g/dL   AST 27 15 - 41 U/L   ALT 25 0 - 44 U/L   Alkaline Phosphatase 85 38 - 126 U/L   Total  Bilirubin 0.8 0.3 - 1.2 mg/dL   GFR, Estimated >64 >68 mL/min   Anion gap 8 5 - 15          Assessment & Plan:    Encounter Diagnoses  Name Primary?  . Controlled type 2 diabetes mellitus without complication, without long-term current use of insulin (HCC) Yes  . Hyperlipidemia, unspecified hyperlipidemia type   . Uncomplicated asthma, unspecified asthma severity, unspecified whether persistent   . Morbid obesity (HCC)   . Screening mammography declined   . Colon cancer screening declined      -Reviewed labs with pt -Pt to continue current medications  -pt Declines screening mammogram -encouraged covid vaccination but pt says no -pt Declines ifobt -pt to follow up 3 months.  She is to contact office sooner prn

## 2020-06-02 ENCOUNTER — Other Ambulatory Visit: Payer: Self-pay | Admitting: Physician Assistant

## 2020-08-11 ENCOUNTER — Other Ambulatory Visit: Payer: Self-pay | Admitting: Physician Assistant

## 2020-08-11 DIAGNOSIS — E785 Hyperlipidemia, unspecified: Secondary | ICD-10-CM

## 2020-08-11 DIAGNOSIS — E119 Type 2 diabetes mellitus without complications: Secondary | ICD-10-CM

## 2020-08-22 ENCOUNTER — Other Ambulatory Visit (HOSPITAL_COMMUNITY)
Admission: RE | Admit: 2020-08-22 | Discharge: 2020-08-22 | Disposition: A | Discharge status: Home / Self Care | Payer: Self-pay | Source: Ambulatory Visit | Attending: Physician Assistant | Admitting: Physician Assistant

## 2020-08-22 ENCOUNTER — Other Ambulatory Visit: Payer: Self-pay

## 2020-08-22 DIAGNOSIS — E119 Type 2 diabetes mellitus without complications: Secondary | ICD-10-CM | POA: Insufficient documentation

## 2020-08-22 DIAGNOSIS — E785 Hyperlipidemia, unspecified: Secondary | ICD-10-CM | POA: Insufficient documentation

## 2020-08-22 LAB — LIPID PANEL
Cholesterol: 129 mg/dL (ref 0–200)
HDL: 52 mg/dL (ref >40)
LDL Cholesterol: 57 mg/dL (ref 0–99)
Total CHOL/HDL Ratio: 2.5 RATIO
Triglycerides: 101 mg/dL (ref <150)
VLDL: 20 mg/dL (ref 0–40)

## 2020-08-22 LAB — COMPREHENSIVE METABOLIC PANEL
ALT: 20 U/L (ref 0–44)
AST: 20 U/L (ref 15–41)
Albumin: 3.4 g/dL — ABNORMAL LOW (ref 3.5–5.0)
Alkaline Phosphatase: 72 U/L (ref 38–126)
Anion gap: 7 (ref 5–15)
BUN: 15 mg/dL (ref 6–20)
CO2: 29 mmol/L (ref 22–32)
Calcium: 9.2 mg/dL (ref 8.9–10.3)
Chloride: 102 mmol/L (ref 98–111)
Creatinine, Ser: 0.96 mg/dL (ref 0.44–1.00)
GFR, Estimated: >60 mL/min (ref >60)
Glucose, Bld: 127 mg/dL — ABNORMAL HIGH (ref 70–99)
Potassium: 4.1 mmol/L (ref 3.5–5.1)
Sodium: 138 mmol/L (ref 135–145)
Total Bilirubin: 0.9 mg/dL (ref 0.3–1.2)
Total Protein: 8.1 g/dL (ref 6.5–8.1)

## 2020-08-22 LAB — HEMOGLOBIN A1C
Hgb A1c MFr Bld: 6.2 % — ABNORMAL HIGH (ref 4.8–5.6)
Mean Plasma Glucose: 131.24 mg/dL

## 2020-08-23 ENCOUNTER — Encounter: Payer: Self-pay | Admitting: Physician Assistant

## 2020-08-23 ENCOUNTER — Ambulatory Visit: Payer: Self-pay | Admitting: Physician Assistant

## 2020-08-23 DIAGNOSIS — E785 Hyperlipidemia, unspecified: Secondary | ICD-10-CM

## 2020-08-23 DIAGNOSIS — E119 Type 2 diabetes mellitus without complications: Secondary | ICD-10-CM

## 2020-08-23 DIAGNOSIS — Z5320 Procedure and treatment not carried out because of patient's decision for unspecified reasons: Secondary | ICD-10-CM

## 2020-08-23 NOTE — Progress Notes (Signed)
There were no vitals taken for this visit.   Subjective:    Patient ID: Jaclyn Ruiz, female    DOB: 22-Dec-1970, 50 y.o.   MRN: [id number]  HPI: Jaclyn Ruiz is a 50 y.o. female presenting on 08/23/2020 for No chief complaint on file.   HPI    This is a telemedicine appointment through [employer] due to coronavirus pandemic.   I connected with  [name] on 08/23/20 by a video enabled telemedicine application and verified that I am speaking with the correct person using two identifiers.   I discussed the limitations of evaluation and management by telemedicine. The patient expressed understanding and agreed to proceed.  Pt is at work; she works in a bindery.  Provider is at office.  Pt says she has No insurance     Pt is 49yoF with DM and dyslipidemia.  She says she is Doing good except some night sweats for about a month.  Pt had ablation 8 years ago.    Relevant past medical, surgical, family and social history reviewed and updated as indicated. Interim medical history since our last visit reviewed. Allergies and medications reviewed and updated.   Current Outpatient Medications:  .  atorvastatin (LIPITOR) 20 MG tablet, Take 1 tablet (20 mg total) by mouth daily., Disp: 90 tablet, Rfl: 1 .  glipiZIDE (GLUCOTROL) 5 MG tablet, Take 1 tablet (5 mg total) by mouth 2 (two) times daily before a meal., Disp: 180 tablet, Rfl: 1 .  metFORMIN (GLUCOPHAGE) 1000 MG tablet, Take 1 tablet (1,000 mg total) by mouth 2 (two) times daily with a meal., Disp: 180 tablet, Rfl: 1 .  PROVENTIL HFA 108 (90 Base) MCG/ACT inhaler, INHALE 2 PUFFS BY MOUTH EVERY 6 HOURS AS NEEDED FOR COUGHING, WHEEZING, OR SHORTNESS OF BREATH, Disp: 20.1 g, Rfl: 0     Review of Systems  Per HPI unless specifically indicated above     Objective:    There were no vitals taken for this visit.  Wt Readings from Last 3 Encounters:  05/24/20 265 lb (120.2 kg)  01/07/20 270 lb 3.2 oz (122.6 kg)  09/04/19 276 lb  (125.2 kg)    Physical Exam Constitutional:      General: She is not in acute distress.    Appearance: She is not toxic-appearing.  HENT:     Head: Normocephalic and atraumatic.     Mouth/Throat:     Dentition: Abnormal dentition.     Comments: Many teeth missing Pulmonary:     Effort: No respiratory distress.     Comments: Pt is talking in complete sentences without sob.  Neurological:     Mental Status: She is alert and oriented to person, place, and time.  Psychiatric:        Attention and Perception: Attention normal.        Speech: Speech normal.        Behavior: Behavior normal. Behavior is cooperative.     Results for orders placed or performed during the hospital encounter of 08/22/20  Lipid panel  Result Value Ref Range   Cholesterol 129 0 - 200 mg/dL   Triglycerides 703 <500 mg/dL   HDL 52 >93 mg/dL   Total CHOL/HDL Ratio 2.5 RATIO   VLDL 20 0 - 40 mg/dL   LDL Cholesterol 57 0 - 99 mg/dL  Comprehensive metabolic panel  Result Value Ref Range   Sodium 138 135 - 145 mmol/L   Potassium 4.1 3.5 - 5.1 mmol/L  Chloride 102 98 - 111 mmol/L   CO2 29 22 - 32 mmol/L   Glucose, Bld 127 (H) 70 - 99 mg/dL   BUN 15 6 - 20 mg/dL   Creatinine, Ser 4.65 0.44 - 1.00 mg/dL   Calcium 9.2 8.9 - 03.5 mg/dL   Total Protein 8.1 6.5 - 8.1 g/dL   Albumin 3.4 (L) 3.5 - 5.0 g/dL   AST 20 15 - 41 U/L   ALT 20 0 - 44 U/L   Alkaline Phosphatase 72 38 - 126 U/L   Total Bilirubin 0.9 0.3 - 1.2 mg/dL   GFR, Estimated >46 >56 mL/min   Anion gap 7 5 - 15  Hemoglobin A1c  Result Value Ref Range   Hgb A1c MFr Bld 6.2 (H) 4.8 - 5.6 %   Mean Plasma Glucose 131.24 mg/dL      Assessment & Plan:    Encounter Diagnoses  Name Primary?  . Controlled type 2 diabetes mellitus without complication, without long-term current use of insulin (HCC) Yes  . Hyperlipidemia, unspecified hyperlipidemia type   . Screening mammography declined      -reviewed labs with pt -pt to continue current  medication -pt Declines screenng mammogram -pt to follow up 3 months.  She is to contact office sooner prn

## 2020-09-04 ENCOUNTER — Other Ambulatory Visit: Payer: Self-pay | Admitting: Physician Assistant

## 2020-09-14 ENCOUNTER — Ambulatory Visit: Payer: Self-pay | Admitting: Physician Assistant

## 2020-09-14 ENCOUNTER — Other Ambulatory Visit: Payer: Self-pay

## 2020-09-14 DIAGNOSIS — M25511 Pain in right shoulder: Secondary | ICD-10-CM

## 2020-09-14 MED ORDER — DICLOFENAC SODIUM 75 MG PO TBEC: 75.0000 mg | DELAYED_RELEASE_TABLET | Freq: Two times a day (BID) | ORAL | 0 refills | Status: DC | PRN

## 2020-09-14 NOTE — Progress Notes (Signed)
   There were no vitals taken for this visit.   Subjective:    Patient ID: Jaclyn Ruiz, female    DOB: 1970/07/17, 50 y.o.   MRN: [id number]  HPI: Jaclyn Ruiz is a 50 y.o. female presenting on 09/14/2020 for No chief complaint on file.   HPI   Pt had a negative covid 19 screening questionnaire.   R shoulder pain started 5/22.   No known injury.  Pt thought she had just slept on it wrong.  She was seen in ER 5/25.   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   She doesn't pick up whole boxes of books, just indiviual books.       Relevant past medical, surgical, family and social history reviewed and updated as indicated. Interim medical history since our last visit reviewed. Allergies and medications reviewed and updated.    Current Outpatient Medications:  .  atorvastatin (LIPITOR) 20 MG tablet, Take 1 tablet (20 mg total) by mouth daily., Disp: 90 tablet, Rfl: 1 .  glipiZIDE (GLUCOTROL) 5 MG tablet, Take 1 tablet (5 mg total) by mouth 2 (two) times daily before a meal., Disp: 180 tablet, Rfl: 1 .  HYDROCODONE-ACETAMINOPHEN PO, Take by mouth., Disp: , Rfl:  .  metFORMIN (GLUCOPHAGE) 1000 MG tablet, Take 1 tablet (1,000 mg total) by mouth 2 (two) times daily with a meal., Disp: 180 tablet, Rfl: 1 .  PROVENTIL HFA 108 (90 Base) MCG/ACT inhaler, INHALE 2 PUFFS BY MOUTH EVERY 6 HOURS AS NEEDED FOR COUGHING, WHEEZING, OR SHORTNESS OF BREATH, Disp: 20.1 g, Rfl: 0     Review of Systems  Per HPI unless specifically indicated above     Objective:    There were no vitals taken for this visit.  Wt Readings from Last 3 Encounters:  05/24/20 265 lb (120.2 kg)  01/07/20 270 lb 3.2 oz (122.6 kg)  09/04/19 276 lb (125.2 kg)    Physical Exam Constitutional:      General: She is not in acute distress.    Appearance: She is not toxic-appearing.  HENT:     Head: Normocephalic and atraumatic.  Pulmonary:     Effort: Pulmonary effort is normal. No respiratory distress.   Musculoskeletal:     Right shoulder: Tenderness present. No deformity. Decreased range of motion.     Comments: Pt is unable to abduct or extend right shoulder more than 15 degrees.  The shoulder is tender.  No redness or deformity.   Neurological:     Mental Status: She is alert and oriented to person, place, and time.  Psychiatric:        Behavior: Behavior normal.              Assessment & Plan:    Encounter Diagnosis  Name Primary?  . Acute pain of right shoulder Yes     -pt to Ice the shoulder 10-20 minutes 3 -4 times daily -Refer to orthopedist -pt was given application for [employer] financial assistance -pt educated on Pendulum swings and encouraged to do these to prevent worsening shouder -rx diclofenac  Note to be out of work yesterday and today.  Then no use right arm thereafter  -pt to follow up as scheduled

## 2020-09-27 ENCOUNTER — Ambulatory Visit: Payer: Self-pay | Admitting: Orthopedic Surgery

## 2020-09-27 ENCOUNTER — Encounter: Payer: Self-pay | Admitting: Orthopedic Surgery

## 2020-11-14 ENCOUNTER — Other Ambulatory Visit: Payer: Self-pay | Admitting: Physician Assistant

## 2020-11-14 DIAGNOSIS — E119 Type 2 diabetes mellitus without complications: Secondary | ICD-10-CM

## 2020-11-14 DIAGNOSIS — E785 Hyperlipidemia, unspecified: Secondary | ICD-10-CM

## 2020-11-25 ENCOUNTER — Other Ambulatory Visit (HOSPITAL_COMMUNITY)
Admission: RE | Admit: 2020-11-25 | Discharge: 2020-11-25 | Disposition: A | Discharge status: Home / Self Care | Payer: Self-pay | Source: Ambulatory Visit | Attending: Physician Assistant | Admitting: Physician Assistant

## 2020-11-25 DIAGNOSIS — E119 Type 2 diabetes mellitus without complications: Secondary | ICD-10-CM | POA: Insufficient documentation

## 2020-11-25 DIAGNOSIS — E785 Hyperlipidemia, unspecified: Secondary | ICD-10-CM | POA: Insufficient documentation

## 2020-11-25 LAB — COMPREHENSIVE METABOLIC PANEL
ALT: 29 U/L (ref 0–44)
AST: 24 U/L (ref 15–41)
Albumin: 3.4 g/dL — ABNORMAL LOW (ref 3.5–5.0)
Alkaline Phosphatase: 96 U/L (ref 38–126)
Anion gap: 6 (ref 5–15)
BUN: 23 mg/dL — ABNORMAL HIGH (ref 6–20)
CO2: 28 mmol/L (ref 22–32)
Calcium: 9 mg/dL (ref 8.9–10.3)
Chloride: 103 mmol/L (ref 98–111)
Creatinine, Ser: 1.03 mg/dL — ABNORMAL HIGH (ref 0.44–1.00)
GFR, Estimated: >60 mL/min (ref >60)
Glucose, Bld: 86 mg/dL (ref 70–99)
Potassium: 3.9 mmol/L (ref 3.5–5.1)
Sodium: 137 mmol/L (ref 135–145)
Total Bilirubin: 0.8 mg/dL (ref 0.3–1.2)
Total Protein: 8.1 g/dL (ref 6.5–8.1)

## 2020-11-25 LAB — LIPID PANEL
Cholesterol: 132 mg/dL (ref 0–200)
HDL: 36 mg/dL — ABNORMAL LOW (ref >40)
LDL Cholesterol: 67 mg/dL (ref 0–99)
Total CHOL/HDL Ratio: 3.7 RATIO
Triglycerides: 143 mg/dL (ref <150)
VLDL: 29 mg/dL (ref 0–40)

## 2020-11-26 LAB — MICROALBUMIN, URINE: Microalb, Ur: 22.9 ug/mL — ABNORMAL HIGH

## 2020-11-28 ENCOUNTER — Other Ambulatory Visit: Payer: Self-pay | Admitting: Physician Assistant

## 2020-11-28 ENCOUNTER — Encounter: Payer: Self-pay | Admitting: Physician Assistant

## 2020-11-28 ENCOUNTER — Other Ambulatory Visit: Payer: Self-pay

## 2020-11-28 ENCOUNTER — Ambulatory Visit: Payer: Self-pay | Admitting: Physician Assistant

## 2020-11-28 VITALS — BP 141/89 | HR 76 | Temp 97.5°F

## 2020-11-28 DIAGNOSIS — E785 Hyperlipidemia, unspecified: Secondary | ICD-10-CM

## 2020-11-28 DIAGNOSIS — E669 Obesity, unspecified: Secondary | ICD-10-CM

## 2020-11-28 DIAGNOSIS — E119 Type 2 diabetes mellitus without complications: Secondary | ICD-10-CM

## 2020-11-28 DIAGNOSIS — Z5320 Procedure and treatment not carried out because of patient's decision for unspecified reasons: Secondary | ICD-10-CM

## 2020-11-28 DIAGNOSIS — M25511 Pain in right shoulder: Secondary | ICD-10-CM

## 2020-11-28 LAB — HEMOGLOBIN A1C
Hgb A1c MFr Bld: 6.7 % — ABNORMAL HIGH (ref 4.8–5.6)
Mean Plasma Glucose: 145.59 mg/dL

## 2020-11-28 MED ORDER — DICLOFENAC SODIUM 75 MG PO TBEC: 75.0000 mg | DELAYED_RELEASE_TABLET | Freq: Two times a day (BID) | ORAL | 0 refills | Status: DC | PRN

## 2020-11-28 NOTE — Progress Notes (Signed)
BP (!) 141/89   Pulse 76   Temp (!) 97.5 F (36.4 C)   SpO2 96%    Subjective:    Patient ID: Jaclyn Ruiz, female    DOB: March 19, 1971, 50 y.o.   MRN: [id number]  HPI: Jaclyn Ruiz is a 50 y.o. female presenting on 11/28/2020 for Diabetes and Hyperlipidemia   HPI   Pt had a negative covid 19 screening questionnaire  Chief Complaint  Patient presents with   Diabetes   Hyperlipidemia    Pt was a no-show to her orthopedics appointment.  She says it was because they wanted money HOWEVER she did not bother to submit the [employer] financial assistance applicaton taht she was given  Pt is still not vaccinated for covid  She says the diclofenac worked well and she is requesting a refill of this for her shoulder pain   Relevant past medical, surgical, family and social history reviewed and updated as indicated. Interim medical history since our last visit reviewed. Allergies and medications reviewed and updated.   Current Outpatient Medications:    atorvastatin (LIPITOR) 20 MG tablet, Take 1 tablet (20 mg total) by mouth daily., Disp: 90 tablet, Rfl: 1   diclofenac (VOLTAREN) 75 MG EC tablet, Take 1 tablet (75 mg total) by mouth 2 (two) times daily as needed., Disp: 60 tablet, Rfl: 0   glipiZIDE (GLUCOTROL) 5 MG tablet, Take 1 tablet (5 mg total) by mouth 2 (two) times daily before a meal., Disp: 180 tablet, Rfl: 1   metFORMIN (GLUCOPHAGE) 1000 MG tablet, Take 1 tablet (1,000 mg total) by mouth 2 (two) times daily with a meal., Disp: 180 tablet, Rfl: 1   HYDROCODONE-ACETAMINOPHEN PO, Take by mouth., Disp: , Rfl:    PROVENTIL HFA 108 (90 Base) MCG/ACT inhaler, INHALE 2 PUFFS BY MOUTH EVERY 6 HOURS AS NEEDED FOR COUGHING, WHEEZING, OR SHORTNESS OF BREATH (Patient not taking: Reported on 11/28/2020), Disp: 20.1 g, Rfl: 0     Review of Systems  Per HPI unless specifically indicated above     Objective:    BP (!) 141/89   Pulse 76   Temp (!) 97.5 F (36.4 C)   SpO2 96%    Wt Readings from Last 3 Encounters:  05/24/20 265 lb (120.2 kg)  01/07/20 270 lb 3.2 oz (122.6 kg)  09/04/19 276 lb (125.2 kg)    Physical Exam Vitals reviewed.  Constitutional:      General: She is not in acute distress.    Appearance: She is well-developed. She is obese. She is not ill-appearing.  HENT:     Head: Normocephalic and atraumatic.  Cardiovascular:     Rate and Rhythm: Normal rate and regular rhythm.  Pulmonary:     Effort: Pulmonary effort is normal.     Breath sounds: Normal breath sounds.  Abdominal:     General: Bowel sounds are normal.     Palpations: Abdomen is soft. There is no mass.     Tenderness: There is no abdominal tenderness.  Musculoskeletal:     Cervical back: Neck supple.     Right lower leg: No edema.     Left lower leg: No edema.  Lymphadenopathy:     Cervical: No cervical adenopathy.  Skin:    General: Skin is warm and dry.  Neurological:     Mental Status: She is alert and oriented to person, place, and time.  Psychiatric:        Behavior: Behavior normal.  Results for orders placed or performed during the hospital encounter of 11/25/20  Lipid panel  Result Value Ref Range   Cholesterol 132 0 - 200 mg/dL   Triglycerides 185 <631 mg/dL   HDL 36 (L) >49 mg/dL   Total CHOL/HDL Ratio 3.7 RATIO   VLDL 29 0 - 40 mg/dL   LDL Cholesterol 67 0 - 99 mg/dL  Comprehensive metabolic panel  Result Value Ref Range   Sodium 137 135 - 145 mmol/L   Potassium 3.9 3.5 - 5.1 mmol/L   Chloride 103 98 - 111 mmol/L   CO2 28 22 - 32 mmol/L   Glucose, Bld 86 70 - 99 mg/dL   BUN 23 (H) 6 - 20 mg/dL   Creatinine, Ser 7.02 (H) 0.44 - 1.00 mg/dL   Calcium 9.0 8.9 - 63.7 mg/dL   Total Protein 8.1 6.5 - 8.1 g/dL   Albumin 3.4 (L) 3.5 - 5.0 g/dL   AST 24 15 - 41 U/L   ALT 29 0 - 44 U/L   Alkaline Phosphatase 96 38 - 126 U/L   Total Bilirubin 0.8 0.3 - 1.2 mg/dL   GFR, Estimated >85 >88 mL/min   Anion gap 6 5 - 15  Microalbumin, urine  Result Value  Ref Range   Microalb, Ur 22.9 (H) Not Estab. ug/mL      Assessment & Plan:    Encounter Diagnoses  Name Primary?   Controlled type 2 diabetes mellitus without complication, without long-term current use of insulin (HCC) Yes   Hyperlipidemia, unspecified hyperlipidemia type    Screening mammography declined    Colon cancer screening declined    Obesity, unspecified classification, unspecified obesity type, unspecified whether serious comorbidity present    Acute pain of right shoulder     Reviewed labs with pt. A1c pending no medication changes today.  Will consider discontinuing glipizide if a1 c good -pt Declined FIT/colon cancer screening -pt declined mammogram/breast cancer screening -Refilled diclofenac per pt request -pt to follow up 3 months.  She is to contact office sooner prn

## 2020-12-02 ENCOUNTER — Other Ambulatory Visit: Payer: Self-pay | Admitting: Physician Assistant

## 2020-12-05 ENCOUNTER — Other Ambulatory Visit: Payer: Self-pay | Admitting: Physician Assistant

## 2021-02-15 ENCOUNTER — Other Ambulatory Visit: Payer: Self-pay | Admitting: Physician Assistant

## 2021-02-15 DIAGNOSIS — E785 Hyperlipidemia, unspecified: Secondary | ICD-10-CM

## 2021-02-15 DIAGNOSIS — E119 Type 2 diabetes mellitus without complications: Secondary | ICD-10-CM

## 2021-02-27 ENCOUNTER — Other Ambulatory Visit (HOSPITAL_COMMUNITY)
Admission: RE | Admit: 2021-02-27 | Discharge: 2021-02-27 | Disposition: A | Discharge status: Home / Self Care | Payer: Self-pay | Source: Ambulatory Visit | Attending: Physician Assistant | Admitting: Physician Assistant

## 2021-02-27 DIAGNOSIS — E119 Type 2 diabetes mellitus without complications: Secondary | ICD-10-CM | POA: Insufficient documentation

## 2021-02-27 DIAGNOSIS — E785 Hyperlipidemia, unspecified: Secondary | ICD-10-CM | POA: Insufficient documentation

## 2021-02-27 LAB — LIPID PANEL
Cholesterol: 142 mg/dL (ref 0–200)
HDL: 43 mg/dL (ref >40)
LDL Cholesterol: 69 mg/dL (ref 0–99)
Total CHOL/HDL Ratio: 3.3 RATIO
Triglycerides: 152 mg/dL — ABNORMAL HIGH (ref <150)
VLDL: 30 mg/dL (ref 0–40)

## 2021-02-27 LAB — COMPREHENSIVE METABOLIC PANEL
ALT: 27 U/L (ref 0–44)
AST: 24 U/L (ref 15–41)
Albumin: 3.5 g/dL (ref 3.5–5.0)
Alkaline Phosphatase: 78 U/L (ref 38–126)
Anion gap: 7 (ref 5–15)
BUN: 22 mg/dL — ABNORMAL HIGH (ref 6–20)
CO2: 28 mmol/L (ref 22–32)
Calcium: 9.3 mg/dL (ref 8.9–10.3)
Chloride: 101 mmol/L (ref 98–111)
Creatinine, Ser: 1.01 mg/dL — ABNORMAL HIGH (ref 0.44–1.00)
GFR, Estimated: >60 mL/min (ref >60)
Glucose, Bld: 129 mg/dL — ABNORMAL HIGH (ref 70–99)
Potassium: 4.5 mmol/L (ref 3.5–5.1)
Sodium: 136 mmol/L (ref 135–145)
Total Bilirubin: 0.8 mg/dL (ref 0.3–1.2)
Total Protein: 8.2 g/dL — ABNORMAL HIGH (ref 6.5–8.1)

## 2021-02-27 LAB — HEMOGLOBIN A1C
Hgb A1c MFr Bld: 7 % — ABNORMAL HIGH (ref 4.8–5.6)
Mean Plasma Glucose: 154.2 mg/dL

## 2021-02-28 ENCOUNTER — Ambulatory Visit: Payer: Self-pay | Admitting: Physician Assistant

## 2021-02-28 ENCOUNTER — Encounter: Payer: Self-pay | Admitting: Physician Assistant

## 2021-02-28 DIAGNOSIS — M25512 Pain in left shoulder: Secondary | ICD-10-CM

## 2021-02-28 DIAGNOSIS — Z5320 Procedure and treatment not carried out because of patient's decision for unspecified reasons: Secondary | ICD-10-CM

## 2021-02-28 DIAGNOSIS — E669 Obesity, unspecified: Secondary | ICD-10-CM

## 2021-02-28 DIAGNOSIS — E119 Type 2 diabetes mellitus without complications: Secondary | ICD-10-CM

## 2021-02-28 DIAGNOSIS — E785 Hyperlipidemia, unspecified: Secondary | ICD-10-CM

## 2021-02-28 NOTE — Progress Notes (Signed)
There were no vitals taken for this visit.   Subjective:    Patient ID: Jaclyn Ruiz, female    DOB: 1971-02-07, 50 y.o.   MRN: [id number]  HPI: Jaclyn Ruiz is a 50 y.o. female presenting on 02/28/2021 for No chief complaint on file.   HPI  This is a telemedicine appointment through [employer].  I connected with  [name] on 03/01/21 by a video enabled telemedicine application and verified that I am speaking with the correct person using two identifiers.   I discussed the limitations of evaluation and management by telemedicine. The patient expressed understanding and agreed to proceed.  Pt is at work.  Provider is at office.     Pt is 50yoF with routine follow up for DM and dyslipidemia  Pt says she has been getting some Tingling down left arm for 2 months.  It goes away when she lifts her arm.  She can't lay on that arm because it hurts.  She denies injury.   She had shoulder problems and was given diclofenac and her pain went away and then the tingling started.  Pt had been referred to orthopedics for the shoulder but was a no-show to her appointment.     Relevant past medical, surgical, family and social history reviewed and updated as indicated. Interim medical history since our last visit reviewed. Allergies and medications reviewed and updated.   Current Outpatient Medications:    atorvastatin (LIPITOR) 20 MG tablet, TAKE 1 Tablet BY MOUTH ONCE EVERY DAY, Disp: 90 tablet, Rfl: 1   glipiZIDE (GLUCOTROL) 5 MG tablet, TAKE 1 Tablet  BY MOUTH TWICE DAILY BEFORE A MEAL, Disp: 180 tablet, Rfl: 1   metFORMIN (GLUCOPHAGE) 1000 MG tablet, TAKE 1 Tablet  BY MOUTH TWICE DAILY WITH A MEAL, Disp: 180 tablet, Rfl: 1   PROVENTIL HFA 108 (90 Base) MCG/ACT inhaler, INHALE 2 PUFFS BY MOUTH EVERY 6 HOURS AS NEEDED FOR COUGHING, WHEEZING, OR SHORTNESS OF BREATH, Disp: 20.1 g, Rfl: 1   diclofenac (VOLTAREN) 75 MG EC tablet, Take 1 tablet (75 mg total) by mouth 2 (two) times daily as  needed. (Patient not taking: Reported on 02/28/2021), Disp: 60 tablet, Rfl: 0   HYDROCODONE-ACETAMINOPHEN PO, Take by mouth. (Patient not taking: Reported on 02/28/2021), Disp: , Rfl:     Review of Systems  Per HPI unless specifically indicated above     Objective:    There were no vitals taken for this visit.  Wt Readings from Last 3 Encounters:  05/24/20 265 lb (120.2 kg)  01/07/20 270 lb 3.2 oz (122.6 kg)  09/04/19 276 lb (125.2 kg)    Physical Exam Constitutional:      General: She is not in acute distress.    Appearance: She is obese. She is not toxic-appearing.  HENT:     Head: Normocephalic and atraumatic.  Pulmonary:     Effort: Pulmonary effort is normal. No respiratory distress.  Musculoskeletal:     Left shoulder: No deformity. Normal range of motion.  Neurological:     Mental Status: She is alert and oriented to person, place, and time.  Psychiatric:        Attention and Perception: Attention normal.        Speech: Speech normal.        Behavior: Behavior normal.    Results for orders placed or performed during the hospital encounter of 02/27/21  Lipid panel  Result Value Ref Range   Cholesterol 142 0 -  200 mg/dL   Triglycerides 600 (H) <150 mg/dL   HDL 43 >45 mg/dL   Total CHOL/HDL Ratio 3.3 RATIO   VLDL 30 0 - 40 mg/dL   LDL Cholesterol 69 0 - 99 mg/dL  Comprehensive metabolic panel  Result Value Ref Range   Sodium 136 135 - 145 mmol/L   Potassium 4.5 3.5 - 5.1 mmol/L   Chloride 101 98 - 111 mmol/L   CO2 28 22 - 32 mmol/L   Glucose, Bld 129 (H) 70 - 99 mg/dL   BUN 22 (H) 6 - 20 mg/dL   Creatinine, Ser 9.97 (H) 0.44 - 1.00 mg/dL   Calcium 9.3 8.9 - 74.1 mg/dL   Total Protein 8.2 (H) 6.5 - 8.1 g/dL   Albumin 3.5 3.5 - 5.0 g/dL   AST 24 15 - 41 U/L   ALT 27 0 - 44 U/L   Alkaline Phosphatase 78 38 - 126 U/L   Total Bilirubin 0.8 0.3 - 1.2 mg/dL   GFR, Estimated >42 >39 mL/min   Anion gap 7 5 - 15  Hemoglobin A1c  Result Value Ref Range   Hgb  A1c MFr Bld 7.0 (H) 4.8 - 5.6 %   Mean Plasma Glucose 154.2 mg/dL      Assessment & Plan:    Encounter Diagnoses  Name Primary?   Controlled type 2 diabetes mellitus without complication, without long-term current use of insulin (HCC) Yes   Hyperlipidemia, unspecified hyperlipidemia type    Screening mammography declined    Left shoulder pain, unspecified chronicity    Obesity, unspecified classification, unspecified obesity type, unspecified whether serious comorbidity present      -reviewed labs with pt.  She will continue current medications but is encouraged to watch diabetic diet -will re-refer to Refer orthopedics for the shoulder.  She has diclofenac and is encouraged to take it for several days to help with inflammation and pain.  Will avoid prednisone at this time in light of DM -pt is encouraged to get her application for [employer] financial assistance submitted -pt Declined screening mammogram -refer for DM eye exam -pt to follow up  3 months.  She is to contact office sooner prn

## 2021-03-01 ENCOUNTER — Encounter: Payer: Self-pay | Admitting: Orthopedic Surgery

## 2021-05-22 ENCOUNTER — Other Ambulatory Visit: Payer: Self-pay | Admitting: Physician Assistant

## 2021-05-22 DIAGNOSIS — E785 Hyperlipidemia, unspecified: Secondary | ICD-10-CM

## 2021-05-22 DIAGNOSIS — E119 Type 2 diabetes mellitus without complications: Secondary | ICD-10-CM

## 2021-06-05 ENCOUNTER — Other Ambulatory Visit: Payer: Self-pay

## 2021-06-05 ENCOUNTER — Ambulatory Visit: Payer: Self-pay | Admitting: Physician Assistant

## 2021-06-05 ENCOUNTER — Encounter: Payer: Self-pay | Admitting: Physician Assistant

## 2021-06-05 VITALS — BP 138/64 | HR 95 | Temp 97.8°F | Wt 275.0 lb

## 2021-06-05 DIAGNOSIS — Z5320 Procedure and treatment not carried out because of patient's decision for unspecified reasons: Secondary | ICD-10-CM

## 2021-06-05 DIAGNOSIS — E785 Hyperlipidemia, unspecified: Secondary | ICD-10-CM

## 2021-06-05 DIAGNOSIS — J90 Pleural effusion, not elsewhere classified: Secondary | ICD-10-CM

## 2021-06-05 DIAGNOSIS — E119 Type 2 diabetes mellitus without complications: Secondary | ICD-10-CM

## 2021-06-05 NOTE — Progress Notes (Signed)
BP 138/64    Pulse 95    Temp 97.8 F (36.6 C)    Wt 275 lb (124.7 kg)    SpO2 97%    BMI 48.71 kg/m    Subjective:    Patient ID: Jaclyn Ruiz, female    DOB: 06/28/1970, 51 y.o.   MRN: [id number]  HPI: Jaclyn Ruiz is a 51 y.o. female presenting on 06/05/2021 for Diabetes and Hyperlipidemia   HPI  Chief Complaint  Patient presents with   Diabetes   Hyperlipidemia      She was in hospital with CAP recently.  She says she is still a bit sob but is using her inhaler.  She hasn't heard on her cafa yet.  She was referred to orthopedics for her shoulder  She did not get her labs drawn.  She says she didn't know she needed to.    Relevant past medical, surgical, family and social history reviewed and updated as indicated. Interim medical history since our last visit reviewed. Allergies and medications reviewed and updated.   Current Outpatient Medications:    atorvastatin (LIPITOR) 20 MG tablet, TAKE 1 Tablet BY MOUTH ONCE EVERY DAY, Disp: 90 tablet, Rfl: 1   glipiZIDE (GLUCOTROL) 5 MG tablet, TAKE 1 Tablet  BY MOUTH TWICE DAILY BEFORE A MEAL, Disp: 180 tablet, Rfl: 1   metFORMIN (GLUCOPHAGE) 1000 MG tablet, TAKE 1 Tablet  BY MOUTH TWICE DAILY WITH A MEAL, Disp: 180 tablet, Rfl: 1   PROVENTIL HFA 108 (90 Base) MCG/ACT inhaler, INHALE 2 PUFFS BY MOUTH EVERY 6 HOURS AS NEEDED FOR COUGHING, WHEEZING, OR SHORTNESS OF BREATH, Disp: 20.1 g, Rfl: 1   diclofenac (VOLTAREN) 75 MG EC tablet, Take 1 tablet (75 mg total) by mouth 2 (two) times daily as needed. (Patient not taking: Reported on 02/28/2021), Disp: 60 tablet, Rfl: 0   HYDROCODONE-ACETAMINOPHEN PO, Take by mouth. (Patient not taking: Reported on 02/28/2021), Disp: , Rfl:      Review of Systems  Per HPI unless specifically indicated above     Objective:    BP 138/64    Pulse 95    Temp 97.8 F (36.6 C)    Wt 275 lb (124.7 kg)    SpO2 97%    BMI 48.71 kg/m   Wt Readings from Last 3 Encounters:  06/05/21 275 lb  (124.7 kg)  05/24/20 265 lb (120.2 kg)  01/07/20 270 lb 3.2 oz (122.6 kg)    Physical Exam Vitals reviewed.  Constitutional:      General: She is not in acute distress.    Appearance: She is well-developed. She is obese. She is not toxic-appearing.  HENT:     Head: Normocephalic and atraumatic.  Cardiovascular:     Rate and Rhythm: Normal rate and regular rhythm.  Pulmonary:     Effort: Pulmonary effort is normal. No respiratory distress.     Breath sounds: Normal breath sounds. No wheezing, rhonchi or rales.  Abdominal:     General: Bowel sounds are normal.     Palpations: Abdomen is soft. There is no mass.     Tenderness: There is no abdominal tenderness.  Musculoskeletal:     Cervical back: Neck supple.     Right lower leg: No edema.     Left lower leg: No edema.  Lymphadenopathy:     Cervical: No cervical adenopathy.  Skin:    General: Skin is warm and dry.  Neurological:     Mental Status: She is alert  and oriented to person, place, and time.  Psychiatric:        Behavior: Behavior normal.         Assessment & Plan:    Encounter Diagnoses  Name Primary?   Controlled type 2 diabetes mellitus without complication, without long-term current use of insulin (HCC) Yes   Hyperlipidemia, unspecified hyperlipidemia type    Screening mammography declined    Colon cancer screening declined    Morbid obesity (HCC)    Pleural effusion      -pt to Get fasting labs drawn.  She will be called with results -pt has appointment in April for DM eye exam -will Check on cafa (for orthopedics) fot pt -Pt declined mammogram -Pt declined FIT test -She had effusion also on her CT when diagnosed with CAP with recommendations to repeat in 6-12 week. (mid April) -DM foot exam updated -pt to follow up 3 months.  She is to contact office sooner prn

## 2021-06-06 ENCOUNTER — Other Ambulatory Visit: Payer: Self-pay | Admitting: Physician Assistant

## 2021-06-06 ENCOUNTER — Telehealth: Payer: Self-pay

## 2021-06-06 NOTE — Telephone Encounter (Signed)
Called pt to inform cafa valid until May & informed to call Ortho to make an appt while she still has assistance, pt voiced understanding.

## 2021-06-08 ENCOUNTER — Telehealth: Payer: Self-pay

## 2021-06-08 NOTE — Telephone Encounter (Signed)
Spoke with pt & informed of CT scheduled 07/28/2021 at 2:30pm.

## 2021-06-16 ENCOUNTER — Other Ambulatory Visit (HOSPITAL_COMMUNITY)
Admission: RE | Admit: 2021-06-16 | Discharge: 2021-06-16 | Disposition: A | Discharge status: Home / Self Care | Payer: Self-pay | Source: Ambulatory Visit | Attending: Physician Assistant | Admitting: Physician Assistant

## 2021-06-16 ENCOUNTER — Ambulatory Visit (INDEPENDENT_AMBULATORY_CARE_PROVIDER_SITE_OTHER): Payer: Self-pay | Admitting: Orthopedic Surgery

## 2021-06-16 ENCOUNTER — Other Ambulatory Visit: Payer: Self-pay

## 2021-06-16 ENCOUNTER — Encounter: Payer: Self-pay | Admitting: Orthopedic Surgery

## 2021-06-16 ENCOUNTER — Ambulatory Visit: Payer: Self-pay

## 2021-06-16 VITALS — BP 178/103 | HR 85 | Ht 63.0 in | Wt 271.0 lb

## 2021-06-16 DIAGNOSIS — M25512 Pain in left shoulder: Secondary | ICD-10-CM

## 2021-06-16 DIAGNOSIS — E119 Type 2 diabetes mellitus without complications: Secondary | ICD-10-CM

## 2021-06-16 DIAGNOSIS — S46812A Strain of other muscles, fascia and tendons at shoulder and upper arm level, left arm, initial encounter: Secondary | ICD-10-CM

## 2021-06-16 DIAGNOSIS — E785 Hyperlipidemia, unspecified: Secondary | ICD-10-CM

## 2021-06-16 LAB — COMPREHENSIVE METABOLIC PANEL
ALT: 27 U/L (ref 0–44)
AST: 22 U/L (ref 15–41)
Albumin: 3.5 g/dL (ref 3.5–5.0)
Alkaline Phosphatase: 75 U/L (ref 38–126)
Anion gap: 8 (ref 5–15)
BUN: 18 mg/dL (ref 6–20)
CO2: 27 mmol/L (ref 22–32)
Calcium: 9.5 mg/dL (ref 8.9–10.3)
Chloride: 102 mmol/L (ref 98–111)
Creatinine, Ser: 0.96 mg/dL (ref 0.44–1.00)
GFR, Estimated: >60 mL/min (ref >60)
Glucose, Bld: 145 mg/dL — ABNORMAL HIGH (ref 70–99)
Potassium: 5.1 mmol/L (ref 3.5–5.1)
Sodium: 137 mmol/L (ref 135–145)
Total Bilirubin: 0.4 mg/dL (ref 0.3–1.2)
Total Protein: 7.9 g/dL (ref 6.5–8.1)

## 2021-06-16 LAB — LIPID PANEL
Cholesterol: 137 mg/dL (ref 0–200)
HDL: 42 mg/dL (ref >40)
LDL Cholesterol: 74 mg/dL (ref 0–99)
Total CHOL/HDL Ratio: 3.3 RATIO
Triglycerides: 103 mg/dL (ref <150)
VLDL: 21 mg/dL (ref 0–40)

## 2021-06-16 LAB — HEMOGLOBIN A1C
Hgb A1c MFr Bld: 6.4 % — ABNORMAL HIGH (ref 4.8–5.6)
Mean Plasma Glucose: 136.98 mg/dL

## 2021-06-16 MED ORDER — GABAPENTIN 100 MG PO CAPS: 100.0000 mg | ORAL_CAPSULE | Freq: Three times a day (TID) | ORAL | 0 refills | Status: DC

## 2021-06-16 NOTE — Progress Notes (Signed)
New Patient Visit ? ?Assessment: ?Jaclyn Ruiz is a 51 y.o. female with the following: ?1. Acute pain of left shoulder ?2. Trapezius muscle strain, left, initial encounter ? ?Plan: ?Patient referred to clinic today for left shoulder pain.  After discussing this with the patient today, her pain is primarily within the trapezius muscle, as well as the left side of her neck.  Issues are most likely emanating from her neck.  Radiographs of the left shoulder are normal.  She has good range of motion and good strength of the left shoulder.  She does note some discomfort in the trapezius muscle with strength and range of motion testing.  I have recommended appropriate medications including gabapentin, as well as pain medications as needed.  We also discussed appropriate exercises for her shoulder.  Recommended heating pad or shower to loosen up the muscle, followed by stretching and strengthening activities.  Exercises were provided for the patient to clinic today.  She will let me know how she does, and schedule follow-up as needed. ? ? ?Follow-up: ?Return if symptoms worsen or fail to improve. ? ?Subjective: ? ?Chief Complaint  ?Patient presents with  ? New Patient (Initial Visit)  ? Shoulder Pain  ?  LT shoulder/ painful x 6 months ?Has numbness and tingling radiating down left arm  ? ? ?History of Present Illness: ?AMAANI Ruiz is a 51 y.o. female who has been referred to clinic today by Jacquelin Hawking, PA-C for evaluation of left shoulder pain.  She states she had pain in the left shoulder for the past 6 months.  No specific injury.  Pain is in the superior aspect of the shoulder, with some radiating pain distally to her hand.  She also has some numbness and tingling into the left hand.  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  Shortly thereafter, she started to notice some pain in the left shoulder. ? ? ?Review of Systems: ?No fevers or chills ?+ numbness &  tingling ?No chest pain ?No shortness of breath ?No bowel or bladder dysfunction ?No GI distress ?No headaches ? ? ?Medical History: ? ?Past Medical History:  ?Diagnosis Date  ? Asthma   ? Diabetes mellitus without complication (HCC)   ? Hypertension   ? Renal stones   ? ? ?Past Surgical History:  ?Procedure Laterality Date  ? CESAREAN SECTION    ? times 2  ? ENDOMETRIAL ABLATION  2008  ? TUBAL LIGATION  1994  ? bilateral tubal ligation  ? ? ?Family History  ?Problem Relation Age of Onset  ? Diabetes Father   ? Kidney failure Mother   ? Seizures Daughter   ? ?Social History  ? ?Tobacco Use  ? Smoking status: Never  ? Smokeless tobacco: Never  ?Vaping Use  ? Vaping Use: Never used  ?Substance Use Topics  ? Alcohol use: No  ? Drug use: No  ? ? ?No Known Allergies ? ?Current Meds  ?Medication Sig  ? albuterol (VENTOLIN HFA) 108 (90 Base) MCG/ACT inhaler INHALE 2 PUFFS BY MOUTH EVERY 6 HOURS AS NEEDED FOR COUGHING, WHEEZING, OR SHORTNESS OF BREATH  ? atorvastatin (LIPITOR) 20 MG tablet TAKE 1 Tablet BY MOUTH ONCE EVERY DAY  ? diclofenac (VOLTAREN) 75 MG EC tablet Take 1 tablet (75 mg total) by mouth 2 (two) times daily as needed.  ? gabapentin (NEURONTIN) 100 MG capsule Take 1 capsule (100 mg total) by mouth 3 (three) times daily.  ?  glipiZIDE (GLUCOTROL) 5 MG tablet TAKE 1 Tablet  BY MOUTH TWICE DAILY BEFORE A MEAL  ? metFORMIN (GLUCOPHAGE) 1000 MG tablet TAKE 1 Tablet  BY MOUTH TWICE DAILY WITH A MEAL  ? ? ?Objective: ?BP (!) 178/103   Pulse 85   Ht 5\' 3"  (1.6 m)   Wt 271 lb (122.9 kg)   BMI 48.01 kg/m?  ? ?Physical Exam: ? ?General: Alert and oriented. and No acute distress. ?Gait: Normal gait. ? ?Evaluation of the left shoulder demonstrates no deformity.  No swelling or atrophy is appreciated.  She does have tenderness to palpation within the trapezius muscle.  Tenderness to palpation in the left cervical muscles.  160 degrees of active forward flexion.  90 degrees of active abduction.  Internal rotation to T12.   External rotation to 45 degrees.  Pain in the trapezius with strength testing.  Strength is 5/5.  No atrophy in her hand.  She feels tingling in the long finger of the hand.  Fingers are warm and well-perfused ? ?IMAGING: ?I personally ordered and reviewed the following images ? ?X-ray of the left shoulder was obtained in clinic today.  No acute injuries are noted.  The glenohumeral joint is reduced.  AC joint appears to have minimal degenerative changes.  There is no proximal humeral migration. ? ?Impression: Normal left shoulder x-rays. ?New Medications:  ?Meds ordered this encounter  ?Medications  ? gabapentin (NEURONTIN) 100 MG capsule  ?  Sig: Take 1 capsule (100 mg total) by mouth 3 (three) times daily.  ?  Dispense:  90 capsule  ?  Refill:  0  ? ? ? ? ? , MD ? ?06/16/2021 ?8:32 PM ? ? ?

## 2021-06-19 ENCOUNTER — Telehealth: Payer: Self-pay

## 2021-06-19 NOTE — Telephone Encounter (Signed)
Provided patient with contact information to [facility] office to obtain copy of approval letter that is being requested form her orthopaedic provider's office ?

## 2021-07-18 ENCOUNTER — Telehealth: Payer: Self-pay | Admitting: Physician Assistant

## 2021-07-18 NOTE — Telephone Encounter (Signed)
Pt was called to notify her of DM eye exam scheduled for later this month.  When she got on phone, she says she went for eye exam about 3 weeks ago at [facility] in [place].  So pt will not need referral for DM eye exam.   Will have pt sign release at her next OV to get records from [facility]. ?

## 2021-07-28 ENCOUNTER — Ambulatory Visit (HOSPITAL_COMMUNITY)
Admission: RE | Admit: 2021-07-28 | Discharge: 2021-07-28 | Disposition: A | Discharge status: Home / Self Care | Payer: Self-pay | Source: Ambulatory Visit | Attending: Physician Assistant | Admitting: Physician Assistant

## 2021-07-28 ENCOUNTER — Encounter (HOSPITAL_COMMUNITY): Payer: Self-pay

## 2021-07-28 DIAGNOSIS — J90 Pleural effusion, not elsewhere classified: Secondary | ICD-10-CM | POA: Insufficient documentation

## 2021-08-22 ENCOUNTER — Other Ambulatory Visit: Payer: Self-pay | Admitting: Physician Assistant

## 2021-08-22 DIAGNOSIS — E785 Hyperlipidemia, unspecified: Secondary | ICD-10-CM

## 2021-08-22 DIAGNOSIS — E119 Type 2 diabetes mellitus without complications: Secondary | ICD-10-CM

## 2021-08-31 ENCOUNTER — Other Ambulatory Visit: Payer: Self-pay | Admitting: Physician Assistant

## 2021-09-06 ENCOUNTER — Ambulatory Visit: Payer: Self-pay | Admitting: Physician Assistant

## 2021-09-08 ENCOUNTER — Other Ambulatory Visit (HOSPITAL_COMMUNITY)
Admission: RE | Admit: 2021-09-08 | Discharge: 2021-09-08 | Disposition: A | Discharge status: Home / Self Care | Payer: Self-pay | Source: Ambulatory Visit | Attending: Physician Assistant | Admitting: Physician Assistant

## 2021-09-08 DIAGNOSIS — E119 Type 2 diabetes mellitus without complications: Secondary | ICD-10-CM | POA: Insufficient documentation

## 2021-09-08 DIAGNOSIS — E785 Hyperlipidemia, unspecified: Secondary | ICD-10-CM | POA: Insufficient documentation

## 2021-09-08 LAB — COMPREHENSIVE METABOLIC PANEL
ALT: 26 U/L (ref 0–44)
AST: 23 U/L (ref 15–41)
Albumin: 3.3 g/dL — ABNORMAL LOW (ref 3.5–5.0)
Alkaline Phosphatase: 59 U/L (ref 38–126)
Anion gap: 5 (ref 5–15)
BUN: 15 mg/dL (ref 6–20)
CO2: 28 mmol/L (ref 22–32)
Calcium: 8.9 mg/dL (ref 8.9–10.3)
Chloride: 106 mmol/L (ref 98–111)
Creatinine, Ser: 0.9 mg/dL (ref 0.44–1.00)
GFR, Estimated: >60 mL/min (ref >60)
Glucose, Bld: 110 mg/dL — ABNORMAL HIGH (ref 70–99)
Potassium: 4.3 mmol/L (ref 3.5–5.1)
Sodium: 139 mmol/L (ref 135–145)
Total Bilirubin: 0.4 mg/dL (ref 0.3–1.2)
Total Protein: 7.3 g/dL (ref 6.5–8.1)

## 2021-09-08 LAB — LIPID PANEL
Cholesterol: 116 mg/dL (ref 0–200)
HDL: 41 mg/dL (ref >40)
LDL Cholesterol: 56 mg/dL (ref 0–99)
Total CHOL/HDL Ratio: 2.8 RATIO
Triglycerides: 97 mg/dL (ref <150)
VLDL: 19 mg/dL (ref 0–40)

## 2021-09-08 LAB — HEMOGLOBIN A1C
Hgb A1c MFr Bld: 6.5 % — ABNORMAL HIGH (ref 4.8–5.6)
Mean Plasma Glucose: 139.85 mg/dL

## 2021-09-12 ENCOUNTER — Ambulatory Visit: Payer: Self-pay | Admitting: Physician Assistant

## 2021-09-12 ENCOUNTER — Encounter: Payer: Self-pay | Admitting: Physician Assistant

## 2021-09-12 VITALS — BP 152/84 | HR 80 | Temp 97.0°F | Wt 284.0 lb

## 2021-09-12 DIAGNOSIS — E785 Hyperlipidemia, unspecified: Secondary | ICD-10-CM

## 2021-09-12 DIAGNOSIS — Z6841 Body Mass Index (BMI) 40.0 and over, adult: Secondary | ICD-10-CM

## 2021-09-12 DIAGNOSIS — Z5320 Procedure and treatment not carried out because of patient's decision for unspecified reasons: Secondary | ICD-10-CM

## 2021-09-12 DIAGNOSIS — I10 Essential (primary) hypertension: Secondary | ICD-10-CM

## 2021-09-12 DIAGNOSIS — E119 Type 2 diabetes mellitus without complications: Secondary | ICD-10-CM

## 2021-09-12 MED ORDER — METFORMIN HCL 1000 MG PO TABS: 1000.0000 mg | ORAL_TABLET | Freq: Two times a day (BID) | ORAL | 0 refills | Status: DC

## 2021-09-12 MED ORDER — LOSARTAN POTASSIUM 50 MG PO TABS: 50.0000 mg | ORAL_TABLET | Freq: Every day | ORAL | 0 refills | Status: DC

## 2021-09-12 MED ORDER — GLIPIZIDE 5 MG PO TABS: 5.0000 mg | ORAL_TABLET | Freq: Two times a day (BID) | ORAL | 1 refills | Status: DC

## 2021-09-12 NOTE — Progress Notes (Unsigned)
BP (!) 152/84   Pulse 80   Temp (!) 97 F (36.1 C)   Wt 284 lb (128.8 kg)   SpO2 96%   BMI 50.31 kg/m    Subjective:    Patient ID: Jaclyn Ruiz, female    DOB: April 12, 1971, 51 y.o.   MRN: [id number]  HPI: Jaclyn Ruiz is a 51 y.o. female presenting on 09/12/2021 for Diabetes and Hyperlipidemia   HPI  Relevant past medical, surgical, family and social history reviewed and updated as indicated. Interim medical history since our last visit reviewed. Allergies and medications reviewed and updated.   Current Outpatient Medications:    albuterol (VENTOLIN HFA) 108 (90 Base) MCG/ACT inhaler, INHALE 2 PUFFS BY MOUTH EVERY 6 HOURS AS NEEDED FOR COUGHING, WHEEZING, OR SHORTNESS OF BREATH, Disp: 20.1 g, Rfl: 1   atorvastatin (LIPITOR) 20 MG tablet, TAKE 1 Tablet BY MOUTH ONCE EVERY DAY, Disp: 90 tablet, Rfl: 0   gabapentin (NEURONTIN) 100 MG capsule, Take 1 capsule (100 mg total) by mouth 3 (three) times daily., Disp: 90 capsule, Rfl: 0   glipiZIDE (GLUCOTROL) 5 MG tablet, TAKE 1 Tablet  BY MOUTH TWICE DAILY BEFORE A MEAL, Disp: 180 tablet, Rfl: 1   metFORMIN (GLUCOPHAGE) 1000 MG tablet, TAKE 1 Tablet  BY MOUTH TWICE DAILY WITH A MEAL, Disp: 180 tablet, Rfl: 1   diclofenac (VOLTAREN) 75 MG EC tablet, Take 1 tablet (75 mg total) by mouth 2 (two) times daily as needed. (Patient not taking: Reported on 09/12/2021), Disp: 60 tablet, Rfl: 0    Review of Systems  Per HPI unless specifically indicated above     Objective:    BP (!) 152/84   Pulse 80   Temp (!) 97 F (36.1 C)   Wt 284 lb (128.8 kg)   SpO2 96%   BMI 50.31 kg/m   Wt Readings from Last 3 Encounters:  09/12/21 284 lb (128.8 kg)  06/16/21 271 lb (122.9 kg)  06/05/21 275 lb (124.7 kg)    Physical Exam  Results for orders placed or performed during the hospital encounter of 09/08/21  Lipid panel  Result Value Ref Range   Cholesterol 116 0 - 200 mg/dL   Triglycerides 97 <798 mg/dL   HDL 41 >92 mg/dL   Total  CHOL/HDL Ratio 2.8 RATIO   VLDL 19 0 - 40 mg/dL   LDL Cholesterol 56 0 - 99 mg/dL  Hemoglobin J1H  Result Value Ref Range   Hgb A1c MFr Bld 6.5 (H) 4.8 - 5.6 %   Mean Plasma Glucose 139.85 mg/dL  Comprehensive metabolic panel  Result Value Ref Range   Sodium 139 135 - 145 mmol/L   Potassium 4.3 3.5 - 5.1 mmol/L   Chloride 106 98 - 111 mmol/L   CO2 28 22 - 32 mmol/L   Glucose, Bld 110 (H) 70 - 99 mg/dL   BUN 15 6 - 20 mg/dL   Creatinine, Ser 4.17 0.44 - 1.00 mg/dL   Calcium 8.9 8.9 - 40.8 mg/dL   Total Protein 7.3 6.5 - 8.1 g/dL   Albumin 3.3 (L) 3.5 - 5.0 g/dL   AST 23 15 - 41 U/L   ALT 26 0 - 44 U/L   Alkaline Phosphatase 59 38 - 126 U/L   Total Bilirubin 0.4 0.3 - 1.2 mg/dL   GFR, Estimated >14 >48 mL/min   Anion gap 5 5 - 15        Assessment & Plan:     Declines mammogram  Encouraged covid vaccination Add losartan Refer to RD She has cafa F/u 3 mo

## 2021-09-12 NOTE — Patient Instructions (Signed)
COVID-19 vaccination significantly lowers your risk of severe illness, hospitalization, and death if you get infected. Compared to people who are up to date with their COVID-19 vaccinations, unvaccinated people aremore likely to get COVID-19, much more likely to be hospitalized with COVID-19, and much more likely to die from COVID-19. ?Like all vaccines, COVID-19 vaccines are not 100% effective at preventing infection. Some people who are up to date with their COVID-19 vaccinations will get COVID-19 breakthrough infection. However, staying up to date with your COVID-19 vaccinations means that you are less likely to have a breakthrough infection and, if you do get sick, you are less likely to get severely ill or die. Staying up to date with COVID-19 vaccination also means you are less likely to spread the disease to others and increases your protection against new variants of SARS-CoV-2, the virus that causes COVID-19. ? ?

## 2021-09-13 ENCOUNTER — Telehealth: Payer: Self-pay | Admitting: Student

## 2021-09-13 NOTE — Telephone Encounter (Signed)
PA checked into the Saxenda. Jaclyn Ruiz shows a weight loss of 12-23 pounds by the 56th week of taking this medication. Rapid weight re-gain can occur when medication is d/c.   PA strongly recommends for pt to NOT take this medication since pt only has 3 pens which is not enough to see results.   Although, if pt is adamant about taking Saxenda, pt is urged to d/c glipizide when on Saxenda as it can cause her blood sugar to drop and can cause pt to fall out.. If pt decides she does want to take Saxenda pt is to take Saxenda as follows:  0.6 mg a day for 1 week, Then 1.2 mg a day for 1 week, Then 2.4 mg a day for 1 week, Then 3.0 mg a day for 1 week.  LPN called and notified pt of this. Pt verbalized understanding and states she WILL NOT take Saxenda.

## 2021-11-08 ENCOUNTER — Other Ambulatory Visit: Payer: Self-pay | Admitting: Physician Assistant

## 2021-12-05 ENCOUNTER — Other Ambulatory Visit: Payer: Self-pay | Admitting: Physician Assistant

## 2021-12-05 DIAGNOSIS — I10 Essential (primary) hypertension: Secondary | ICD-10-CM

## 2021-12-05 DIAGNOSIS — E785 Hyperlipidemia, unspecified: Secondary | ICD-10-CM

## 2021-12-05 DIAGNOSIS — D649 Anemia, unspecified: Secondary | ICD-10-CM

## 2021-12-05 DIAGNOSIS — E119 Type 2 diabetes mellitus without complications: Secondary | ICD-10-CM

## 2021-12-19 ENCOUNTER — Ambulatory Visit: Payer: Self-pay | Admitting: Physician Assistant

## 2021-12-25 ENCOUNTER — Other Ambulatory Visit (HOSPITAL_COMMUNITY)
Admission: RE | Admit: 2021-12-25 | Discharge: 2021-12-25 | Disposition: A | Discharge status: Home / Self Care | Payer: Self-pay | Source: Ambulatory Visit | Attending: Physician Assistant | Admitting: Physician Assistant

## 2021-12-25 DIAGNOSIS — I10 Essential (primary) hypertension: Secondary | ICD-10-CM

## 2021-12-25 DIAGNOSIS — E119 Type 2 diabetes mellitus without complications: Secondary | ICD-10-CM

## 2021-12-25 DIAGNOSIS — E785 Hyperlipidemia, unspecified: Secondary | ICD-10-CM

## 2021-12-25 DIAGNOSIS — D649 Anemia, unspecified: Secondary | ICD-10-CM

## 2021-12-25 LAB — HEMOGLOBIN A1C
Hgb A1c MFr Bld: 6.8 % — ABNORMAL HIGH (ref 4.8–5.6)
Mean Plasma Glucose: 148.46 mg/dL

## 2021-12-25 LAB — CBC
HCT: 40.4 % (ref 36.0–46.0)
Hemoglobin: 12.5 g/dL (ref 12.0–15.0)
MCH: 26.7 pg (ref 26.0–34.0)
MCHC: 30.9 g/dL (ref 30.0–36.0)
MCV: 86.1 fL (ref 80.0–100.0)
Platelets: 320 10*3/uL (ref 150–400)
RBC: 4.69 MIL/uL (ref 3.87–5.11)
RDW: 15.2 % (ref 11.5–15.5)
WBC: 11 10*3/uL — ABNORMAL HIGH (ref 4.0–10.5)
nRBC: 0 % (ref 0.0–0.2)

## 2021-12-25 LAB — LIPID PANEL
Cholesterol: 145 mg/dL (ref 0–200)
HDL: 42 mg/dL (ref >40)
LDL Cholesterol: 77 mg/dL (ref 0–99)
Total CHOL/HDL Ratio: 3.5 RATIO
Triglycerides: 131 mg/dL (ref <150)
VLDL: 26 mg/dL (ref 0–40)

## 2021-12-25 LAB — COMPREHENSIVE METABOLIC PANEL
ALT: 28 U/L (ref 0–44)
AST: 21 U/L (ref 15–41)
Albumin: 3.5 g/dL (ref 3.5–5.0)
Alkaline Phosphatase: 69 U/L (ref 38–126)
Anion gap: 8 (ref 5–15)
BUN: 16 mg/dL (ref 6–20)
CO2: 26 mmol/L (ref 22–32)
Calcium: 9.3 mg/dL (ref 8.9–10.3)
Chloride: 105 mmol/L (ref 98–111)
Creatinine, Ser: 1.05 mg/dL — ABNORMAL HIGH (ref 0.44–1.00)
GFR, Estimated: >60 mL/min (ref >60)
Glucose, Bld: 124 mg/dL — ABNORMAL HIGH (ref 70–99)
Potassium: 4.1 mmol/L (ref 3.5–5.1)
Sodium: 139 mmol/L (ref 135–145)
Total Bilirubin: 1 mg/dL (ref 0.3–1.2)
Total Protein: 8.3 g/dL — ABNORMAL HIGH (ref 6.5–8.1)

## 2021-12-26 ENCOUNTER — Ambulatory Visit: Payer: Self-pay | Admitting: Physician Assistant

## 2021-12-26 ENCOUNTER — Encounter: Payer: Self-pay | Admitting: Physician Assistant

## 2021-12-26 VITALS — BP 116/76 | HR 78 | Temp 97.8°F | Ht 63.0 in | Wt 283.0 lb

## 2021-12-26 DIAGNOSIS — M25512 Pain in left shoulder: Secondary | ICD-10-CM

## 2021-12-26 DIAGNOSIS — E785 Hyperlipidemia, unspecified: Secondary | ICD-10-CM

## 2021-12-26 DIAGNOSIS — Z5320 Procedure and treatment not carried out because of patient's decision for unspecified reasons: Secondary | ICD-10-CM

## 2021-12-26 DIAGNOSIS — E119 Type 2 diabetes mellitus without complications: Secondary | ICD-10-CM

## 2021-12-26 DIAGNOSIS — I10 Essential (primary) hypertension: Secondary | ICD-10-CM

## 2021-12-26 LAB — MICROALBUMIN, URINE: Microalb, Ur: 110.5 ug/mL — ABNORMAL HIGH

## 2021-12-26 NOTE — Progress Notes (Unsigned)
BP 116/76   Pulse 78   Temp 97.8 F (36.6 C)   Ht 5\' 3"  (1.6 m)   Wt 283 lb (128.4 kg)   SpO2 93%   BMI 50.13 kg/m    Subjective:    Patient ID: Jaclyn Ruiz, female    DOB: [birth year], 51 y.o.   MRN: [id number]  HPI: Jaclyn Ruiz is a 51 y.o. 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   She says she is doing well but she is having pain again in her left shoulder.   She was seen by orthopedics for the shoulder in March this year.      Relevant past medical, surgical, family and social history reviewed and updated as indicated. Interim medical history since our last visit reviewed. Allergies and medications reviewed and updated.   Current Outpatient Medications:    albuterol (VENTOLIN HFA) 108 (90 Base) MCG/ACT inhaler, INHALE 2 PUFFS BY MOUTH EVERY 6 HOURS AS NEEDED FOR COUGHING, WHEEZING, OR SHORTNESS OF BREATH, Disp: 3 each, Rfl: 0   atorvastatin (LIPITOR) 20 MG tablet, TAKE 1 Tablet BY MOUTH ONCE EVERY DAY, Disp: 90 tablet, Rfl: 0   glipiZIDE (GLUCOTROL) 5 MG tablet, Take 1 tablet (5 mg total) by mouth 2 (two) times daily before a meal., Disp: 180 tablet, Rfl: 1   losartan (COZAAR) 50 MG tablet, TAKE 1 Tablet BY MOUTH ONCE EVERY DAY, Disp: 90 tablet, Rfl: 0   metFORMIN (GLUCOPHAGE) 1000 MG tablet, TAKE 1 Tablet  BY MOUTH TWICE DAILY WITH A MEAL, Disp: 180 tablet, Rfl: 0   diclofenac (VOLTAREN) 75 MG EC tablet, Take 1 tablet (75 mg total) by mouth 2 (two) times daily as needed. (Patient not taking: Reported on 12/26/2021), Disp: 60 tablet, Rfl: 0   gabapentin (NEURONTIN) 100 MG capsule, Take 1 capsule (100 mg total) by mouth 3 (three) times daily. (Patient not taking: Reported on 12/26/2021), Disp: 90 capsule, Rfl: 0   Review of Systems  Per HPI unless specifically  indicated above     Objective:    BP 116/76   Pulse 78   Temp 97.8 F (36.6 C)   Ht 5\' 3"  (1.6 m)   Wt 283 lb (128.4 kg)   SpO2 93%   BMI 50.13 kg/m   Wt Readings from Last 3 Encounters:  12/26/21 283 lb (128.4 kg)  09/12/21 284 lb (128.8 kg)  06/16/21 271 lb (122.9 kg)    Physical Exam Vitals reviewed.  Constitutional:      General: She is not in acute distress.    Appearance: She is well-developed. She is obese. She is not toxic-appearing.  HENT:     Head: Normocephalic and atraumatic.  Cardiovascular:     Rate and Rhythm: Normal rate and regular rhythm.  Pulmonary:     Effort: Pulmonary effort is normal.     Breath sounds: Normal breath sounds.  Abdominal:     General: Bowel sounds are normal.     Palpations: Abdomen is soft. There is no mass.     Tenderness: There is no abdominal tenderness.  Musculoskeletal:     Right shoulder: Normal. Normal range of motion.     Left shoulder: No swelling, deformity or bony tenderness. Normal range of motion. Normal strength. Normal pulse.  Cervical back: Neck supple.     Right lower leg: No edema.     Left lower leg: No edema.     Comments: Mild muscle tenderness trapezius/just posterior to clavicle  Lymphadenopathy:     Cervical: No cervical adenopathy.  Skin:    General: Skin is warm and dry.  Neurological:     Mental Status: She is alert and oriented to person, place, and time.  Psychiatric:        Behavior: Behavior normal.     Results for orders placed or performed during the hospital encounter of 12/25/21  CBC  Result Value Ref Range   WBC 11.0 (H) 4.0 - 10.5 K/uL   RBC 4.69 3.87 - 5.11 MIL/uL   Hemoglobin 12.5 12.0 - 15.0 g/dL   HCT 09.6 28.3 - 66.2 %   MCV 86.1 80.0 - 100.0 fL   MCH 26.7 26.0 - 34.0 pg   MCHC 30.9 30.0 - 36.0 g/dL   RDW 94.7 65.4 - 65.0 %   Platelets 320 150 - 400 K/uL   nRBC 0.0 0.0 - 0.2 %  Microalbumin, urine  Result Value Ref Range   Microalb, Ur 110.5 (H) Not Estab. ug/mL   Hemoglobin A1c  Result Value Ref Range   Hgb A1c MFr Bld 6.8 (H) 4.8 - 5.6 %   Mean Plasma Glucose 148.46 mg/dL  Comprehensive metabolic panel  Result Value Ref Range   Sodium 139 135 - 145 mmol/L   Potassium 4.1 3.5 - 5.1 mmol/L   Chloride 105 98 - 111 mmol/L   CO2 26 22 - 32 mmol/L   Glucose, Bld 124 (H) 70 - 99 mg/dL   BUN 16 6 - 20 mg/dL   Creatinine, Ser 3.54 (H) 0.44 - 1.00 mg/dL   Calcium 9.3 8.9 - 65.6 mg/dL   Total Protein 8.3 (H) 6.5 - 8.1 g/dL   Albumin 3.5 3.5 - 5.0 g/dL   AST 21 15 - 41 U/L   ALT 28 0 - 44 U/L   Alkaline Phosphatase 69 38 - 126 U/L   Total Bilirubin 1.0 0.3 - 1.2 mg/dL   GFR, Estimated >81 >27 mL/min   Anion gap 8 5 - 15  Lipid panel  Result Value Ref Range   Cholesterol 145 0 - 200 mg/dL   Triglycerides 517 <001 mg/dL   HDL 42 >74 mg/dL   Total CHOL/HDL Ratio 3.5 RATIO   VLDL 26 0 - 40 mg/dL   LDL Cholesterol 77 0 - 99 mg/dL      Assessment & Plan:   Encounter Diagnoses  Name Primary?   Controlled type 2 diabetes mellitus without complication, without long-term current use of insulin (HCC) Yes   Hyperlipidemia, unspecified hyperlipidemia type    Primary hypertension    Morbid obesity (HCC)    Left shoulder pain, unspecified chronicity    Colon cancer screening declined    Screening mammography declined    Pap smear of cervix declined      -Reviewed labs with pt  -Pt to continue current medications -increase water intake/hydrate   HCM: DM Foot exam- 2/23 DM Eye exam-  2023.  Record request sent for this Pap- long time ago.  pt declined update FIT- pt declined colon cancer screening Mammogram -never.  Pt Declined screening   -Heat/ice to shoulder.  Voltaren gel prn.  Discussed muscular etiology -F/u 4 months.   RTO sooner prn

## 2021-12-26 NOTE — Patient Instructions (Signed)
Voltaren gel 

## 2022-02-22 ENCOUNTER — Encounter: Payer: Self-pay | Admitting: Physician Assistant

## 2022-03-05 ENCOUNTER — Other Ambulatory Visit: Payer: Self-pay | Admitting: Physician Assistant

## 2022-03-06 ENCOUNTER — Other Ambulatory Visit: Payer: Self-pay | Admitting: Physician Assistant

## 2022-04-12 ENCOUNTER — Other Ambulatory Visit: Payer: Self-pay | Admitting: Physician Assistant

## 2022-04-12 DIAGNOSIS — E785 Hyperlipidemia, unspecified: Secondary | ICD-10-CM

## 2022-04-12 DIAGNOSIS — I10 Essential (primary) hypertension: Secondary | ICD-10-CM

## 2022-04-12 DIAGNOSIS — E119 Type 2 diabetes mellitus without complications: Secondary | ICD-10-CM

## 2022-04-30 ENCOUNTER — Other Ambulatory Visit (HOSPITAL_COMMUNITY)
Admission: RE | Admit: 2022-04-30 | Discharge: 2022-04-30 | Disposition: A | Discharge status: Home / Self Care | Payer: Self-pay | Source: Ambulatory Visit | Attending: Physician Assistant | Admitting: Physician Assistant

## 2022-04-30 DIAGNOSIS — E119 Type 2 diabetes mellitus without complications: Secondary | ICD-10-CM | POA: Insufficient documentation

## 2022-04-30 DIAGNOSIS — E785 Hyperlipidemia, unspecified: Secondary | ICD-10-CM | POA: Insufficient documentation

## 2022-04-30 DIAGNOSIS — I10 Essential (primary) hypertension: Secondary | ICD-10-CM | POA: Insufficient documentation

## 2022-04-30 LAB — LIPID PANEL
Cholesterol: 144 mg/dL (ref 0–200)
HDL: 43 mg/dL (ref >40)
LDL Cholesterol: 74 mg/dL (ref 0–99)
Total CHOL/HDL Ratio: 3.3 RATIO
Triglycerides: 135 mg/dL (ref <150)
VLDL: 27 mg/dL (ref 0–40)

## 2022-04-30 LAB — COMPREHENSIVE METABOLIC PANEL
ALT: 22 U/L (ref 0–44)
AST: 20 U/L (ref 15–41)
Albumin: 3.3 g/dL — ABNORMAL LOW (ref 3.5–5.0)
Alkaline Phosphatase: 60 U/L (ref 38–126)
Anion gap: 11 (ref 5–15)
BUN: 15 mg/dL (ref 6–20)
CO2: 26 mmol/L (ref 22–32)
Calcium: 8.9 mg/dL (ref 8.9–10.3)
Chloride: 101 mmol/L (ref 98–111)
Creatinine, Ser: 0.99 mg/dL (ref 0.44–1.00)
GFR, Estimated: >60 mL/min (ref >60)
Glucose, Bld: 149 mg/dL — ABNORMAL HIGH (ref 70–99)
Potassium: 3.8 mmol/L (ref 3.5–5.1)
Sodium: 138 mmol/L (ref 135–145)
Total Bilirubin: 0.6 mg/dL (ref 0.3–1.2)
Total Protein: 7.6 g/dL (ref 6.5–8.1)

## 2022-05-01 ENCOUNTER — Encounter: Payer: Self-pay | Admitting: Physician Assistant

## 2022-05-01 ENCOUNTER — Ambulatory Visit: Payer: Self-pay | Admitting: Physician Assistant

## 2022-05-01 VITALS — BP 118/82 | HR 87 | Temp 93.9°F | Ht 63.0 in | Wt 287.0 lb

## 2022-05-01 DIAGNOSIS — E785 Hyperlipidemia, unspecified: Secondary | ICD-10-CM

## 2022-05-01 DIAGNOSIS — I10 Essential (primary) hypertension: Secondary | ICD-10-CM

## 2022-05-01 DIAGNOSIS — E1165 Type 2 diabetes mellitus with hyperglycemia: Secondary | ICD-10-CM

## 2022-05-01 DIAGNOSIS — Z5320 Procedure and treatment not carried out because of patient's decision for unspecified reasons: Secondary | ICD-10-CM

## 2022-05-01 LAB — HEMOGLOBIN A1C
Hgb A1c MFr Bld: 7.4 % — ABNORMAL HIGH (ref 4.8–5.6)
Mean Plasma Glucose: 166 mg/dL

## 2022-05-01 MED ORDER — SITAGLIPTIN PHOSPHATE 100 MG PO TABS: 100.0000 mg | ORAL_TABLET | Freq: Every day | ORAL | 1 refills | Status: DC

## 2022-05-01 NOTE — Progress Notes (Signed)
BP 118/82   Pulse 87   Temp (!) 93.9 F (34.4 C)   Wt 287 lb (130.2 kg)   SpO2 93%   BMI 50.84 kg/m    Subjective:    Patient ID: Jaclyn Ruiz, female    DOB: [birth year], 52 y.o.   MRN: [id number]  HPI: Jaclyn Ruiz is a 52 y.o. female presenting on 05/01/2022 for Hypertension, Diabetes, and Hyperlipidemia   HPI   Chief Complaint  Patient presents with   Hypertension   Diabetes   Hyperlipidemia    She is still working at [employer]  She says she has no new issues today.    Relevant past medical, surgical, family and social history reviewed and updated as indicated. Interim medical history since our last visit reviewed. Allergies and medications reviewed and updated.   Current Outpatient Medications:    albuterol (VENTOLIN HFA) 108 (90 Base) MCG/ACT inhaler, INHALE 2 PUFFS BY MOUTH EVERY 6 HOURS AS NEEDED FOR COUGHING, WHEEZING, OR SHORTNESS OF BREATH, Disp: 20.1 g, Rfl: 1   atorvastatin (LIPITOR) 20 MG tablet, TAKE 1 Tablet BY MOUTH ONCE EVERY DAY, Disp: 90 tablet, Rfl: 0   glipiZIDE (GLUCOTROL) 5 MG tablet, TAKE 1 Tablet  BY MOUTH TWICE DAILY BEFORE A MEAL, Disp: 180 tablet, Rfl: 1   losartan (COZAAR) 50 MG tablet, TAKE 1 Tablet BY MOUTH ONCE EVERY DAY, Disp: 90 tablet, Rfl: 0   metFORMIN (GLUCOPHAGE) 1000 MG tablet, TAKE 1 Tablet  BY MOUTH TWICE DAILY WITH A MEAL, Disp: 180 tablet, Rfl: 0    Review of Systems  Per HPI unless specifically indicated above     Objective:    BP 118/82   Pulse 87   Temp (!) 93.9 F (34.4 C)   Wt 287 lb (130.2 kg)   SpO2 93%   BMI 50.84 kg/m   Wt Readings from Last 3 Encounters:  05/01/22 287 lb (130.2 kg)  12/26/21 283 lb (128.4 kg)  09/12/21 284 lb (128.8 kg)    Physical Exam Vitals reviewed.  Constitutional:      General: She is not in acute distress.    Appearance: She is well-developed. She is obese. She is not toxic-appearing.  HENT:     Head: Normocephalic and atraumatic.     Right Ear: Tympanic membrane,  ear canal and external ear normal.     Left Ear: Tympanic membrane, ear canal and external ear normal.  Eyes:     Extraocular Movements: Extraocular movements intact.     Conjunctiva/sclera: Conjunctivae normal.     Pupils: Pupils are equal, round, and reactive to light.  Cardiovascular:     Rate and Rhythm: Normal rate and regular rhythm.  Pulmonary:     Effort: Pulmonary effort is normal.     Breath sounds: Normal breath sounds.  Abdominal:     General: Bowel sounds are normal.     Palpations: Abdomen is soft. There is no mass.     Tenderness: There is no abdominal tenderness.  Musculoskeletal:     Cervical back: Neck supple.     Right lower leg: No edema.     Left lower leg: No edema.  Lymphadenopathy:     Cervical: No cervical adenopathy.  Skin:    General: Skin is warm and dry.  Neurological:     Mental Status: She is alert and oriented to person, place, and time.  Psychiatric:        Behavior: Behavior normal.     Results for  orders placed or performed during the hospital encounter of 04/30/22  Hemoglobin A1c  Result Value Ref Range   Hgb A1c MFr Bld 7.4 (H) 4.8 - 5.6 %   Mean Plasma Glucose 166 mg/dL  Lipid panel  Result Value Ref Range   Cholesterol 144 0 - 200 mg/dL   Triglycerides 135 <150 mg/dL   HDL 43 >40 mg/dL   Total CHOL/HDL Ratio 3.3 RATIO   VLDL 27 0 - 40 mg/dL   LDL Cholesterol 74 0 - 99 mg/dL  Comprehensive metabolic panel  Result Value Ref Range   Sodium 138 135 - 145 mmol/L   Potassium 3.8 3.5 - 5.1 mmol/L   Chloride 101 98 - 111 mmol/L   CO2 26 22 - 32 mmol/L   Glucose, Bld 149 (H) 70 - 99 mg/dL   BUN 15 6 - 20 mg/dL   Creatinine, Ser 0.99 0.44 - 1.00 mg/dL   Calcium 8.9 8.9 - 10.3 mg/dL   Total Protein 7.6 6.5 - 8.1 g/dL   Albumin 3.3 (L) 3.5 - 5.0 g/dL   AST 20 15 - 41 U/L   ALT 22 0 - 44 U/L   Alkaline Phosphatase 60 38 - 126 U/L   Total Bilirubin 0.6 0.3 - 1.2 mg/dL   GFR, Estimated >60 >60 mL/min   Anion gap 11 5 - 15       Assessment & Plan:    Encounter Diagnoses  Name Primary?   Uncontrolled type 2 diabetes mellitus with hyperglycemia ([place]) Yes   Primary hypertension    Hyperlipidemia, unspecified hyperlipidemia type    Class 3 severe obesity with body mass index (BMI) of 50.0 to 59.9 in adult, unspecified obesity type, unspecified whether serious comorbidity present [facility])    Colon cancer screening declined    Screening mammography declined      -reviewed labs with pt  -she Declined screening mammogram -she Declined colon cancer screening -will refer for annual Dm eye exam which is due march  -will discontinue glipizide and start januvia.  She is encouraged to follow diabetic diet -she will continue other meds -pt will follow up 3 months.  She is to contact office sooner prn

## 2022-05-28 ENCOUNTER — Other Ambulatory Visit: Payer: Self-pay | Admitting: Physician Assistant

## 2022-07-12 ENCOUNTER — Other Ambulatory Visit: Payer: Self-pay | Admitting: Physician Assistant

## 2022-07-12 DIAGNOSIS — E785 Hyperlipidemia, unspecified: Secondary | ICD-10-CM

## 2022-07-12 DIAGNOSIS — E1165 Type 2 diabetes mellitus with hyperglycemia: Secondary | ICD-10-CM

## 2022-07-12 DIAGNOSIS — I10 Essential (primary) hypertension: Secondary | ICD-10-CM

## 2022-07-31 ENCOUNTER — Ambulatory Visit: Payer: Self-pay | Admitting: Physician Assistant

## 2022-08-14 ENCOUNTER — Other Ambulatory Visit (HOSPITAL_COMMUNITY)
Admission: RE | Admit: 2022-08-14 | Discharge: 2022-08-14 | Disposition: A | Discharge status: Home / Self Care | Payer: Self-pay | Source: Ambulatory Visit | Attending: Physician Assistant | Admitting: Physician Assistant

## 2022-08-14 DIAGNOSIS — I10 Essential (primary) hypertension: Secondary | ICD-10-CM | POA: Insufficient documentation

## 2022-08-14 DIAGNOSIS — E1165 Type 2 diabetes mellitus with hyperglycemia: Secondary | ICD-10-CM | POA: Insufficient documentation

## 2022-08-14 DIAGNOSIS — E785 Hyperlipidemia, unspecified: Secondary | ICD-10-CM | POA: Insufficient documentation

## 2022-08-14 LAB — LIPID PANEL
Cholesterol: 123 mg/dL (ref 0–200)
HDL: 42 mg/dL (ref >40)
LDL Cholesterol: 52 mg/dL (ref 0–99)
Total CHOL/HDL Ratio: 2.9 RATIO
Triglycerides: 147 mg/dL (ref <150)
VLDL: 29 mg/dL (ref 0–40)

## 2022-08-14 LAB — COMPREHENSIVE METABOLIC PANEL
ALT: 21 U/L (ref 0–44)
AST: 18 U/L (ref 15–41)
Albumin: 3.4 g/dL — ABNORMAL LOW (ref 3.5–5.0)
Alkaline Phosphatase: 50 U/L (ref 38–126)
Anion gap: 9 (ref 5–15)
BUN: 21 mg/dL — ABNORMAL HIGH (ref 6–20)
CO2: 27 mmol/L (ref 22–32)
Calcium: 9.6 mg/dL (ref 8.9–10.3)
Chloride: 98 mmol/L (ref 98–111)
Creatinine, Ser: 1.2 mg/dL — ABNORMAL HIGH (ref 0.44–1.00)
GFR, Estimated: 54 mL/min — ABNORMAL LOW (ref >60)
Glucose, Bld: 159 mg/dL — ABNORMAL HIGH (ref 70–99)
Potassium: 4.5 mmol/L (ref 3.5–5.1)
Sodium: 134 mmol/L — ABNORMAL LOW (ref 135–145)
Total Bilirubin: 0.9 mg/dL (ref 0.3–1.2)
Total Protein: 7.7 g/dL (ref 6.5–8.1)

## 2022-08-14 LAB — HEMOGLOBIN A1C
Hgb A1c MFr Bld: 8 % — ABNORMAL HIGH (ref 4.8–5.6)
Mean Plasma Glucose: 182.9 mg/dL

## 2022-08-15 ENCOUNTER — Ambulatory Visit: Payer: Self-pay | Admitting: Physician Assistant

## 2022-08-15 ENCOUNTER — Encounter: Payer: Self-pay | Admitting: Physician Assistant

## 2022-08-15 VITALS — BP 151/90 | HR 100 | Temp 97.9°F | Ht 63.0 in | Wt 285.0 lb

## 2022-08-15 DIAGNOSIS — N1831 Chronic kidney disease, stage 3a: Secondary | ICD-10-CM

## 2022-08-15 DIAGNOSIS — Z5320 Procedure and treatment not carried out because of patient's decision for unspecified reasons: Secondary | ICD-10-CM

## 2022-08-15 DIAGNOSIS — Z2821 Immunization not carried out because of patient refusal: Secondary | ICD-10-CM

## 2022-08-15 DIAGNOSIS — K029 Dental caries, unspecified: Secondary | ICD-10-CM

## 2022-08-15 DIAGNOSIS — I10 Essential (primary) hypertension: Secondary | ICD-10-CM

## 2022-08-15 DIAGNOSIS — E1165 Type 2 diabetes mellitus with hyperglycemia: Secondary | ICD-10-CM

## 2022-08-15 DIAGNOSIS — K0889 Other specified disorders of teeth and supporting structures: Secondary | ICD-10-CM

## 2022-08-15 DIAGNOSIS — E785 Hyperlipidemia, unspecified: Secondary | ICD-10-CM

## 2022-08-15 MED ORDER — AMOXICILLIN 500 MG PO CAPS: 500.0000 mg | ORAL_CAPSULE | Freq: Three times a day (TID) | ORAL | 0 refills | Status: AC

## 2022-08-15 MED ORDER — LOSARTAN POTASSIUM 50 MG PO TABS: ORAL_TABLET | ORAL | 0 refills | Status: DC

## 2022-08-15 MED ORDER — ATORVASTATIN CALCIUM 20 MG PO TABS: ORAL_TABLET | ORAL | 0 refills | Status: DC

## 2022-08-15 MED ORDER — METFORMIN HCL 1000 MG PO TABS: 1000.0000 mg | ORAL_TABLET | Freq: Two times a day (BID) | ORAL | 0 refills | Status: DC

## 2022-08-15 MED ORDER — SITAGLIPTIN PHOSPHATE 100 MG PO TABS: 100.0000 mg | ORAL_TABLET | Freq: Every day | ORAL | 1 refills | Status: DC

## 2022-08-15 NOTE — Patient Instructions (Signed)

## 2022-08-15 NOTE — Progress Notes (Signed)
BP (!) 151/90   Pulse 100   Temp 97.9 F (36.6 C)   SpO2 90%    Subjective:    Patient ID: Jaclyn Ruiz, female    DOB: 1970-08-05, 52 y.o.   MRN: [id number]  HPI: Jaclyn Ruiz is a 52 y.o. female presenting on 08/15/2022 for Diabetes, Hyperlipidemia, Hypertension, and Dental Pain (On R bottom which started hurting 3-4 days ago. Pt also c/o R ear ache. Pt has taken tylenol which doesn't really help. Pt denies swelling, )   HPI    Chief Complaint  Patient presents with   Diabetes   Hyperlipidemia   Hypertension   Dental Pain    On R bottom which started hurting 3-4 days ago. Pt also c/o R ear ache. Pt has taken tylenol which doesn't really help. Pt denies swelling,      She has letter aproved for [facility] medicaid starting 05-17-22 but ineligible for regular medicaid  She drinks about 1/2 bottle water daily and 1 glass sweet ice tea.  She says she is never thirsty  She is eating a lot of fruit.    She has been coughing for about 2 weeks.  She says she was sick.  She has never got covid vaccination.     Relevant past medical, surgical, family and social history reviewed and updated as indicated. Interim medical history since our last visit reviewed. Allergies and medications reviewed and updated.    Current Outpatient Medications:    albuterol (VENTOLIN HFA) 108 (90 Base) MCG/ACT inhaler, INHALE 2 PUFFS BY MOUTH EVERY 6 HOURS AS NEEDED FOR COUGHING, WHEEZING, OR SHORTNESS OF BREATH, Disp: 20.1 g, Rfl: 1   atorvastatin (LIPITOR) 20 MG tablet, TAKE 1 Tablet BY MOUTH ONCE EVERY DAY, Disp: 90 tablet, Rfl: 0   losartan (COZAAR) 50 MG tablet, TAKE 1 Tablet BY MOUTH ONCE EVERY DAY, Disp: 90 tablet, Rfl: 0   metFORMIN (GLUCOPHAGE) 1000 MG tablet, TAKE 1 Tablet  BY MOUTH TWICE DAILY WITH A MEAL, Disp: 180 tablet, Rfl: 0   sitaGLIPtin (JANUVIA) 100 MG tablet, Take 1 tablet (100 mg total) by mouth daily., Disp: 90 tablet, Rfl: 1   Review of Systems  Per HPI unless specifically  indicated above     Objective:    BP (!) 151/90   Pulse 100   Temp 97.9 F (36.6 C)   SpO2 90%   Wt Readings from Last 3 Encounters:  05/01/22 287 lb (130.2 kg)  12/26/21 283 lb (128.4 kg)  09/12/21 284 lb (128.8 kg)     Body mass index is 50.49 kg/m.    Physical Exam Vitals reviewed.  Constitutional:      General: She is not in acute distress.    Appearance: She is well-developed. She is obese. She is not toxic-appearing.  HENT:     Head: Normocephalic and atraumatic.     Right Ear: Tympanic membrane, ear canal and external ear normal.     Left Ear: Tympanic membrane, ear canal and external ear normal.     Mouth/Throat:     Dentition: Abnormal dentition. Dental tenderness and dental caries present. No dental abscesses.      Comments: Teeth decay, missing, tender Cardiovascular:     Rate and Rhythm: Normal rate and regular rhythm.  Pulmonary:     Effort: Pulmonary effort is normal.     Breath sounds: Normal breath sounds.     Comments: Pt with dry cough observed Abdominal:     General: Bowel sounds  are normal.     Palpations: Abdomen is soft. There is no mass.     Tenderness: There is no abdominal tenderness.  Musculoskeletal:     Cervical back: Neck supple.     Right lower leg: No edema.     Left lower leg: No edema.  Lymphadenopathy:     Cervical: No cervical adenopathy.  Skin:    General: Skin is warm and dry.  Neurological:     Mental Status: She is alert and oriented to person, place, and time.  Psychiatric:        Behavior: Behavior normal.     Results for orders placed or performed during the hospital encounter of 08/14/22  Hemoglobin A1c  Result Value Ref Range   Hgb A1c MFr Bld 8.0 (H) 4.8 - 5.6 %   Mean Plasma Glucose 182.9 mg/dL  Lipid panel  Result Value Ref Range   Cholesterol 123 0 - 200 mg/dL   Triglycerides 409 <811 mg/dL   HDL 42 >91 mg/dL   Total CHOL/HDL Ratio 2.9 RATIO   VLDL 29 0 - 40 mg/dL   LDL Cholesterol 52 0 - 99 mg/dL   Comprehensive metabolic panel  Result Value Ref Range   Sodium 134 (L) 135 - 145 mmol/L   Potassium 4.5 3.5 - 5.1 mmol/L   Chloride 98 98 - 111 mmol/L   CO2 27 22 - 32 mmol/L   Glucose, Bld 159 (H) 70 - 99 mg/dL   BUN 21 (H) 6 - 20 mg/dL   Creatinine, Ser 4.78 (H) 0.44 - 1.00 mg/dL   Calcium 9.6 8.9 - 29.5 mg/dL   Total Protein 7.7 6.5 - 8.1 g/dL   Albumin 3.4 (L) 3.5 - 5.0 g/dL   AST 18 15 - 41 U/L   ALT 21 0 - 44 U/L   Alkaline Phosphatase 50 38 - 126 U/L   Total Bilirubin 0.9 0.3 - 1.2 mg/dL   GFR, Estimated 54 (L) >60 mL/min   Anion gap 9 5 - 15      Assessment & Plan:   Encounter Diagnoses  Name Primary?   Uncontrolled type 2 diabetes mellitus with hyperglycemia (HCC) Yes   Primary hypertension    Hyperlipidemia, unspecified hyperlipidemia type    Dentalgia    Dental decay    Stage 3a chronic kidney disease (HCC)    Class 3 severe obesity with body mass index (BMI) of 50.0 to 59.9 in adult, unspecified obesity type, unspecified whether serious comorbidity present (HCC)    Pap smear of cervix declined    Screening mammography declined    Colon cancer screening declined    COVID-19 vaccination declined      -[employer] active until July 1 -Reviewed labs with pt -DM foot exam was updated -pt Declined Screening mammogram -DM eye exam UTD -pt counseled to Increase water.  Increase water to 5-6 bottles day.  Discussed how this will help her impaired renal function.  Will monitor -pt Declined pap -pt Declined colon cancer screening -Pt Declined Covid vaccination -no changes to routine medications today.  Pt was counseled at length about diabetic diet and given reading information.  Will add insulin at next OV if not improved to goal or close to goal -rx amoxil and dental referral -pt to follow up 3 months.  She is to contact office sooner prn

## 2022-08-23 ENCOUNTER — Telehealth: Payer: Self-pay

## 2022-08-23 ENCOUNTER — Other Ambulatory Visit: Payer: Self-pay | Admitting: Physician Assistant

## 2022-09-27 ENCOUNTER — Telehealth: Payer: Self-pay

## 2022-09-27 NOTE — Telephone Encounter (Signed)
Called [facility] client to update her that her [employer] had been approved per [employer] until 08/15/23 and that I have placed that to be visible for her provider.  No further needs at this time, she knows she may contact me of [facility] office for any questions or needs she may have.  Francee Nodal RN [employer]

## 2022-10-30 ENCOUNTER — Telehealth: Payer: Self-pay

## 2022-10-30 NOTE — Telephone Encounter (Signed)
Attempted call for follow up and wellness check today of [facility] client. No answer, left message requesting return call.     Francee Nodal RN [employer]

## 2022-10-31 ENCOUNTER — Other Ambulatory Visit: Payer: Self-pay | Admitting: Physician Assistant

## 2022-10-31 DIAGNOSIS — N1831 Chronic kidney disease, stage 3a: Secondary | ICD-10-CM

## 2022-10-31 DIAGNOSIS — E785 Hyperlipidemia, unspecified: Secondary | ICD-10-CM

## 2022-10-31 DIAGNOSIS — I10 Essential (primary) hypertension: Secondary | ICD-10-CM

## 2022-10-31 DIAGNOSIS — E1165 Type 2 diabetes mellitus with hyperglycemia: Secondary | ICD-10-CM

## 2022-11-21 ENCOUNTER — Ambulatory Visit: Payer: Self-pay | Admitting: Physician Assistant

## 2022-11-23 ENCOUNTER — Other Ambulatory Visit (HOSPITAL_COMMUNITY)
Admission: RE | Admit: 2022-11-23 | Discharge: 2022-11-23 | Disposition: A | Discharge status: Home / Self Care | Payer: Self-pay | Source: Ambulatory Visit | Attending: Physician Assistant | Admitting: Physician Assistant

## 2022-11-23 ENCOUNTER — Other Ambulatory Visit: Payer: Self-pay | Admitting: Physician Assistant

## 2022-11-23 DIAGNOSIS — E785 Hyperlipidemia, unspecified: Secondary | ICD-10-CM | POA: Insufficient documentation

## 2022-11-23 DIAGNOSIS — N1831 Chronic kidney disease, stage 3a: Secondary | ICD-10-CM | POA: Insufficient documentation

## 2022-11-23 DIAGNOSIS — E1165 Type 2 diabetes mellitus with hyperglycemia: Secondary | ICD-10-CM | POA: Insufficient documentation

## 2022-11-23 DIAGNOSIS — I10 Essential (primary) hypertension: Secondary | ICD-10-CM | POA: Insufficient documentation

## 2022-11-23 LAB — LIPID PANEL
Cholesterol: 120 mg/dL (ref 0–200)
HDL: 31 mg/dL — ABNORMAL LOW (ref >40)
LDL Cholesterol: 55 mg/dL (ref 0–99)
Total CHOL/HDL Ratio: 3.9 RATIO
Triglycerides: 171 mg/dL — ABNORMAL HIGH (ref <150)
VLDL: 34 mg/dL (ref 0–40)

## 2022-11-23 LAB — COMPREHENSIVE METABOLIC PANEL
ALT: 17 U/L (ref 0–44)
AST: 16 U/L (ref 15–41)
Albumin: 3.4 g/dL — ABNORMAL LOW (ref 3.5–5.0)
Alkaline Phosphatase: 61 U/L (ref 38–126)
Anion gap: 10 (ref 5–15)
BUN: 21 mg/dL — ABNORMAL HIGH (ref 6–20)
CO2: 22 mmol/L (ref 22–32)
Calcium: 9.5 mg/dL (ref 8.9–10.3)
Chloride: 103 mmol/L (ref 98–111)
Creatinine, Ser: 1.16 mg/dL — ABNORMAL HIGH (ref 0.44–1.00)
GFR, Estimated: 56 mL/min — ABNORMAL LOW (ref >60)
Glucose, Bld: 141 mg/dL — ABNORMAL HIGH (ref 70–99)
Potassium: 3.9 mmol/L (ref 3.5–5.1)
Sodium: 135 mmol/L (ref 135–145)
Total Bilirubin: 0.4 mg/dL (ref 0.3–1.2)
Total Protein: 8.1 g/dL (ref 6.5–8.1)

## 2022-11-23 LAB — HEMOGLOBIN A1C
Hgb A1c MFr Bld: 7.4 % — ABNORMAL HIGH (ref 4.8–5.6)
Mean Plasma Glucose: 165.68 mg/dL

## 2022-11-26 ENCOUNTER — Encounter: Payer: Self-pay | Admitting: Physician Assistant

## 2022-11-26 ENCOUNTER — Ambulatory Visit: Payer: Self-pay | Admitting: Physician Assistant

## 2022-11-26 VITALS — BP 129/85 | HR 84 | Temp 97.8°F | Wt 268.0 lb

## 2022-11-26 DIAGNOSIS — E785 Hyperlipidemia, unspecified: Secondary | ICD-10-CM

## 2022-11-26 DIAGNOSIS — N1831 Chronic kidney disease, stage 3a: Secondary | ICD-10-CM

## 2022-11-26 DIAGNOSIS — E1165 Type 2 diabetes mellitus with hyperglycemia: Secondary | ICD-10-CM

## 2022-11-26 DIAGNOSIS — I10 Essential (primary) hypertension: Secondary | ICD-10-CM

## 2022-11-26 MED ORDER — LOSARTAN POTASSIUM 50 MG PO TABS: ORAL_TABLET | ORAL | 0 refills | Status: DC

## 2022-11-26 MED ORDER — METFORMIN HCL 1000 MG PO TABS: 1000.0000 mg | ORAL_TABLET | Freq: Two times a day (BID) | ORAL | 0 refills | Status: DC

## 2022-11-26 MED ORDER — ATORVASTATIN CALCIUM 20 MG PO TABS: ORAL_TABLET | ORAL | 0 refills | Status: DC

## 2022-11-26 MED ORDER — SITAGLIPTIN PHOSPHATE 100 MG PO TABS: 100.0000 mg | ORAL_TABLET | Freq: Every day | ORAL | 1 refills | Status: DC

## 2022-11-26 NOTE — Patient Instructions (Signed)
-  Increase fluids -Avoid NSAIDS (ibuprofen aleve).  Use tylenol/acetaminophen if needed for pain

## 2022-11-26 NOTE — Progress Notes (Signed)
BP 129/85   Pulse 84   Temp 97.8 F (36.6 C)   Wt 268 lb (121.6 kg)   SpO2 97%   BMI 47.47 kg/m    Subjective:    Patient ID: Jaclyn Ruiz, female    DOB: May 01, 1970, 52 y.o.   MRN: [id number]  HPI: Jaclyn Ruiz is a 52 y.o. female presenting on 11/26/2022 for Diabetes, Hypertension, and Hyperlipidemia   HPI   Chief Complaint  Patient presents with   Diabetes   Hypertension   Hyperlipidemia    Pt has had most teeth removed.    Starting in may and still working on it.   She still has some pain.  She has appt to return on this Thursday to the dentist.  She is planning full denture on top and partial on bottom.    This dental work reduced what she can eat and so has lost weight.  She says she has been trying to drink more but admits that she hasn't really been doing too well with it.      Relevant past medical, surgical, family and social history reviewed and updated as indicated. Interim medical history since our last visit reviewed. Allergies and medications reviewed and updated.   Current Outpatient Medications:    atorvastatin (LIPITOR) 20 MG tablet, TAKE 1 Tablet BY MOUTH ONCE EVERY DAY, Disp: 90 tablet, Rfl: 0   losartan (COZAAR) 50 MG tablet, TAKE 1 Tablet BY MOUTH ONCE EVERY DAY, Disp: 90 tablet, Rfl: 0   metFORMIN (GLUCOPHAGE) 1000 MG tablet, Take 1 tablet (1,000 mg total) by mouth 2 (two) times daily with a meal., Disp: 180 tablet, Rfl: 0   sitaGLIPtin (JANUVIA) 100 MG tablet, Take 1 tablet (100 mg total) by mouth daily., Disp: 90 tablet, Rfl: 1   albuterol (VENTOLIN HFA) 108 (90 Base) MCG/ACT inhaler, INHALE 2 PUFFS BY MOUTH EVERY 6 HOURS AS NEEDED FOR COUGHING, WHEEZING, OR SHORTNESS OF BREATH (Patient not taking: Reported on 11/26/2022), Disp: 20.1 g, Rfl: 1    Review of Systems  Per HPI unless specifically indicated above     Objective:    BP 129/85   Pulse 84   Temp 97.8 F (36.6 C)   Wt 268 lb (121.6 kg)   SpO2 97%   BMI 47.47 kg/m   Wt  Readings from Last 3 Encounters:  11/26/22 268 lb (121.6 kg)  08/15/22 285 lb (129.3 kg)  05/01/22 287 lb (130.2 kg)    Physical Exam Vitals reviewed.  Constitutional:      General: She is not in acute distress.    Appearance: She is well-developed. She is obese. She is not toxic-appearing.  HENT:     Head: Normocephalic and atraumatic.  Cardiovascular:     Rate and Rhythm: Normal rate and regular rhythm.  Pulmonary:     Effort: Pulmonary effort is normal.     Breath sounds: Normal breath sounds.  Abdominal:     General: Bowel sounds are normal.     Palpations: Abdomen is soft. There is no mass.     Tenderness: There is no abdominal tenderness.  Musculoskeletal:     Cervical back: Neck supple.  Lymphadenopathy:     Cervical: No cervical adenopathy.  Skin:    General: Skin is warm and dry.  Neurological:     Mental Status: She is alert and oriented to person, place, and time.  Psychiatric:        Behavior: Behavior normal.     Results  for orders placed or performed during the hospital encounter of 11/23/22  Microalbumin, urine  Result Value Ref Range   Microalb, Ur 40.4 (H) Not Estab. ug/mL  Hemoglobin A1c  Result Value Ref Range   Hgb A1c MFr Bld 7.4 (H) 4.8 - 5.6 %   Mean Plasma Glucose 165.68 mg/dL  Lipid panel  Result Value Ref Range   Cholesterol 120 0 - 200 mg/dL   Triglycerides 562 (H) <150 mg/dL   HDL 31 (L) >13 mg/dL   Total CHOL/HDL Ratio 3.9 RATIO   VLDL 34 0 - 40 mg/dL   LDL Cholesterol 55 0 - 99 mg/dL  Comprehensive metabolic panel  Result Value Ref Range   Sodium 135 135 - 145 mmol/L   Potassium 3.9 3.5 - 5.1 mmol/L   Chloride 103 98 - 111 mmol/L   CO2 22 22 - 32 mmol/L   Glucose, Bld 141 (H) 70 - 99 mg/dL   BUN 21 (H) 6 - 20 mg/dL   Creatinine, Ser 0.86 (H) 0.44 - 1.00 mg/dL   Calcium 9.5 8.9 - 57.8 mg/dL   Total Protein 8.1 6.5 - 8.1 g/dL   Albumin 3.4 (L) 3.5 - 5.0 g/dL   AST 16 15 - 41 U/L   ALT 17 0 - 44 U/L   Alkaline Phosphatase 61  38 - 126 U/L   Total Bilirubin 0.4 0.3 - 1.2 mg/dL   GFR, Estimated 56 (L) >60 mL/min   Anion gap 10 5 - 15      Assessment & Plan:    Encounter Diagnoses  Name Primary?   Uncontrolled type 2 diabetes mellitus with hyperglycemia (HCC) Yes   Primary hypertension    Hyperlipidemia, unspecified hyperlipidemia type    Stage 3a chronic kidney disease (HCC)       -reviewed labs with pt -pt was counseled on increasing fluids with goal of 5-6 bottles water/day.   -discussed CKD and things to improve the function including increasing water and avoiding nsaids -discussed monitoring and referral to nephrology with worsening ckd -pt will continue current medications -pt declines screening mammogram and colon cancer FIT test -pt will follow up 3 months.  She is to contact office sooner prn

## 2023-02-07 ENCOUNTER — Other Ambulatory Visit: Payer: Self-pay | Admitting: Physician Assistant

## 2023-02-07 DIAGNOSIS — E785 Hyperlipidemia, unspecified: Secondary | ICD-10-CM

## 2023-02-07 DIAGNOSIS — E1165 Type 2 diabetes mellitus with hyperglycemia: Secondary | ICD-10-CM

## 2023-02-07 DIAGNOSIS — I10 Essential (primary) hypertension: Secondary | ICD-10-CM

## 2023-02-07 DIAGNOSIS — N1831 Chronic kidney disease, stage 3a: Secondary | ICD-10-CM

## 2023-02-18 ENCOUNTER — Other Ambulatory Visit: Payer: Self-pay | Admitting: Physician Assistant

## 2023-02-22 ENCOUNTER — Other Ambulatory Visit (HOSPITAL_COMMUNITY)
Admission: RE | Admit: 2023-02-22 | Discharge: 2023-02-22 | Disposition: A | Discharge status: Home / Self Care | Payer: Self-pay | Source: Ambulatory Visit | Attending: Physician Assistant | Admitting: Physician Assistant

## 2023-02-22 DIAGNOSIS — E1165 Type 2 diabetes mellitus with hyperglycemia: Secondary | ICD-10-CM | POA: Insufficient documentation

## 2023-02-22 DIAGNOSIS — N1831 Chronic kidney disease, stage 3a: Secondary | ICD-10-CM | POA: Insufficient documentation

## 2023-02-22 DIAGNOSIS — I10 Essential (primary) hypertension: Secondary | ICD-10-CM | POA: Insufficient documentation

## 2023-02-22 DIAGNOSIS — E785 Hyperlipidemia, unspecified: Secondary | ICD-10-CM | POA: Insufficient documentation

## 2023-02-22 LAB — COMPREHENSIVE METABOLIC PANEL
ALT: 18 U/L (ref 0–44)
AST: 17 U/L (ref 15–41)
Albumin: 3.4 g/dL — ABNORMAL LOW (ref 3.5–5.0)
Alkaline Phosphatase: 55 U/L (ref 38–126)
Anion gap: 9 (ref 5–15)
BUN: 22 mg/dL — ABNORMAL HIGH (ref 6–20)
CO2: 25 mmol/L (ref 22–32)
Calcium: 9.2 mg/dL (ref 8.9–10.3)
Chloride: 103 mmol/L (ref 98–111)
Creatinine, Ser: 1.11 mg/dL — ABNORMAL HIGH (ref 0.44–1.00)
GFR, Estimated: 59 mL/min — ABNORMAL LOW (ref >60)
Glucose, Bld: 117 mg/dL — ABNORMAL HIGH (ref 70–99)
Potassium: 4.6 mmol/L (ref 3.5–5.1)
Sodium: 137 mmol/L (ref 135–145)
Total Bilirubin: 0.7 mg/dL (ref <1.2)
Total Protein: 7.2 g/dL (ref 6.5–8.1)

## 2023-02-22 LAB — HEMOGLOBIN A1C
Hgb A1c MFr Bld: 6.9 % — ABNORMAL HIGH (ref 4.8–5.6)
Mean Plasma Glucose: 151.33 mg/dL

## 2023-02-22 LAB — LIPID PANEL
Cholesterol: 127 mg/dL (ref 0–200)
HDL: 42 mg/dL (ref >40)
LDL Cholesterol: 60 mg/dL (ref 0–99)
Total CHOL/HDL Ratio: 3 {ratio}
Triglycerides: 125 mg/dL (ref <150)
VLDL: 25 mg/dL (ref 0–40)

## 2023-02-25 ENCOUNTER — Ambulatory Visit: Payer: Self-pay | Admitting: Physician Assistant

## 2023-02-25 ENCOUNTER — Encounter: Payer: Self-pay | Admitting: Physician Assistant

## 2023-02-25 VITALS — BP 153/80 | HR 88 | Temp 97.5°F

## 2023-02-25 DIAGNOSIS — E785 Hyperlipidemia, unspecified: Secondary | ICD-10-CM

## 2023-02-25 DIAGNOSIS — I10 Essential (primary) hypertension: Secondary | ICD-10-CM

## 2023-02-25 DIAGNOSIS — N1831 Chronic kidney disease, stage 3a: Secondary | ICD-10-CM

## 2023-02-25 DIAGNOSIS — E119 Type 2 diabetes mellitus without complications: Secondary | ICD-10-CM

## 2023-02-25 NOTE — Progress Notes (Signed)
BP (!) 153/80   Pulse 88   Temp (!) 97.5 F (36.4 C)   SpO2 97%    Subjective:    Patient ID: Jaclyn Ruiz, female    DOB: [birth year], 52 y.o.   MRN: [id number]  HPI: Jaclyn Ruiz is a 52 y.o. female presenting on 02/25/2023 for No chief complaint on file.   HPI  Pt is Working less at [employer] and has Second job [employer]- 15 hours- seasonal   She had some Back pain last week.  It has Eased up some now but isn't gone.  She Used ibu b/c apap didn't help.       Relevant past medical, surgical, family and social history reviewed and updated as indicated. Interim medical history since our last visit reviewed. Allergies and medications reviewed and updated.   Current Outpatient Medications:    albuterol (VENTOLIN HFA) 108 (90 Base) MCG/ACT inhaler, INHALE 2 PUFFS BY MOUTH EVERY 6 HOURS AS NEEDED FOR COUGHING, WHEEZING, OR SHORTNESS OF BREATH, Disp: 20.1 g, Rfl: 1   atorvastatin (LIPITOR) 20 MG tablet, TAKE 1 Tablet BY MOUTH ONCE DAILY, Disp: 90 tablet, Rfl: 1   losartan (COZAAR) 50 MG tablet, TAKE 1 Tablet BY MOUTH ONCE DAILY, Disp: 90 tablet, Rfl: 0   metFORMIN (GLUCOPHAGE) 1000 MG tablet, TAKE 1 Tablet  BY MOUTH TWICE DAILY WITH A MEAL, Disp: 180 tablet, Rfl: 0   sitaGLIPtin (JANUVIA) 100 MG tablet, Take 1 tablet (100 mg total) by mouth daily., Disp: 90 tablet, Rfl: 1    Review of Systems  Per HPI unless specifically indicated above     Objective:    BP (!) 153/80   Pulse 88   Temp (!) 97.5 F (36.4 C)   SpO2 97%   Wt Readings from Last 3 Encounters:  11/26/22 268 lb (121.6 kg)  08/15/22 285 lb (129.3 kg)  05/01/22 287 lb (130.2 kg)    Physical Exam Vitals reviewed.  Constitutional:      General: She is not in acute distress.    Appearance: She is well-developed. She is obese. She is not toxic-appearing.  HENT:     Head: Normocephalic and atraumatic.  Cardiovascular:     Rate and Rhythm: Normal rate and regular rhythm.  Pulmonary:     Effort:  Pulmonary effort is normal.     Breath sounds: Normal breath sounds.  Abdominal:     General: Bowel sounds are normal.     Palpations: Abdomen is soft. There is no mass.     Tenderness: There is no abdominal tenderness.  Musculoskeletal:     Cervical back: Neck supple.     Right lower leg: No edema.     Left lower leg: No edema.  Lymphadenopathy:     Cervical: No cervical adenopathy.  Skin:    General: Skin is warm and dry.  Neurological:     Mental Status: She is alert and oriented to person, place, and time.  Psychiatric:        Behavior: Behavior normal.     Results for orders placed or performed during the hospital encounter of 02/22/23  Hemoglobin A1c  Result Value Ref Range   Hgb A1c MFr Bld 6.9 (H) 4.8 - 5.6 %   Mean Plasma Glucose 151.33 mg/dL  Lipid panel  Result Value Ref Range   Cholesterol 127 0 - 200 mg/dL   Triglycerides 782 <956 mg/dL   HDL 42 >21 mg/dL   Total CHOL/HDL Ratio 3.0 RATIO  VLDL 25 0 - 40 mg/dL   LDL Cholesterol 60 0 - 99 mg/dL  Comprehensive metabolic panel  Result Value Ref Range   Sodium 137 135 - 145 mmol/L   Potassium 4.6 3.5 - 5.1 mmol/L   Chloride 103 98 - 111 mmol/L   CO2 25 22 - 32 mmol/L   Glucose, Bld 117 (H) 70 - 99 mg/dL   BUN 22 (H) 6 - 20 mg/dL   Creatinine, Ser 1.61 (H) 0.44 - 1.00 mg/dL   Calcium 9.2 8.9 - 09.6 mg/dL   Total Protein 7.2 6.5 - 8.1 g/dL   Albumin 3.4 (L) 3.5 - 5.0 g/dL   AST 17 15 - 41 U/L   ALT 18 0 - 44 U/L   Alkaline Phosphatase 55 38 - 126 U/L   Total Bilirubin 0.7 <1.2 mg/dL   GFR, Estimated 59 (L) >60 mL/min   Anion gap 9 5 - 15      Assessment & Plan:    Encounter Diagnoses  Name Primary?   Controlled type 2 diabetes mellitus without complication, without long-term current use of insulin (HCC) Yes   Primary hypertension    Hyperlipidemia, unspecified hyperlipidemia type    Stage 3a chronic kidney disease (HCC)       -reviewed labs with pt -pt was given sample diclofenac gel to use  prn (better choice than ibu due to renal function) -pt to continue current meds -pt decline flu vax, covid vax, mammogram, colon cancer screening -pt pt request, f/u 4 months.  She is to contact office sooner prn

## 2023-02-26 ENCOUNTER — Ambulatory Visit: Payer: Self-pay | Admitting: Physician Assistant

## 2023-05-17 ENCOUNTER — Other Ambulatory Visit: Payer: Self-pay | Admitting: Physician Assistant

## 2023-05-21 ENCOUNTER — Telehealth: Payer: Self-pay

## 2023-05-21 NOTE — Telephone Encounter (Signed)
Attempted wellness call/follow up to [facility] client, no answer, left message requesting a return call.   Francee Nodal RN [employer]

## 2023-05-27 ENCOUNTER — Other Ambulatory Visit: Payer: Self-pay | Admitting: Physician Assistant

## 2023-05-27 MED ORDER — ALBUTEROL SULFATE HFA 108 (90 BASE) MCG/ACT IN AERS: 1.0000 | INHALATION_SPRAY | Freq: Four times a day (QID) | RESPIRATORY_TRACT | 1 refills | Status: AC | PRN

## 2023-05-27 MED ORDER — SITAGLIPTIN PHOSPHATE 100 MG PO TABS: 100.0000 mg | ORAL_TABLET | Freq: Every day | ORAL | 0 refills | Status: DC

## 2023-05-27 MED ORDER — METFORMIN HCL 1000 MG PO TABS: 1000.0000 mg | ORAL_TABLET | Freq: Two times a day (BID) | ORAL | 0 refills | Status: DC

## 2023-05-27 MED ORDER — LOSARTAN POTASSIUM 50 MG PO TABS: 50.0000 mg | ORAL_TABLET | Freq: Every day | ORAL | 0 refills | Status: DC

## 2023-05-27 MED ORDER — ATORVASTATIN CALCIUM 20 MG PO TABS: ORAL_TABLET | ORAL | 1 refills | Status: DC

## 2023-06-10 ENCOUNTER — Other Ambulatory Visit: Payer: Self-pay | Admitting: Physician Assistant

## 2023-06-10 DIAGNOSIS — E119 Type 2 diabetes mellitus without complications: Secondary | ICD-10-CM

## 2023-06-10 DIAGNOSIS — E785 Hyperlipidemia, unspecified: Secondary | ICD-10-CM

## 2023-06-10 DIAGNOSIS — I10 Essential (primary) hypertension: Secondary | ICD-10-CM

## 2023-06-10 DIAGNOSIS — N1831 Chronic kidney disease, stage 3a: Secondary | ICD-10-CM

## 2023-06-25 ENCOUNTER — Ambulatory Visit: Payer: Self-pay | Admitting: Physician Assistant

## 2023-07-23 ENCOUNTER — Other Ambulatory Visit (HOSPITAL_COMMUNITY)
Admission: RE | Admit: 2023-07-23 | Discharge: 2023-07-23 | Disposition: A | Discharge status: Home / Self Care | Payer: Self-pay | Source: Ambulatory Visit | Attending: Physician Assistant | Admitting: Physician Assistant

## 2023-07-23 DIAGNOSIS — N1831 Chronic kidney disease, stage 3a: Secondary | ICD-10-CM | POA: Insufficient documentation

## 2023-07-23 DIAGNOSIS — I10 Essential (primary) hypertension: Secondary | ICD-10-CM | POA: Insufficient documentation

## 2023-07-23 DIAGNOSIS — E785 Hyperlipidemia, unspecified: Secondary | ICD-10-CM | POA: Insufficient documentation

## 2023-07-23 DIAGNOSIS — E119 Type 2 diabetes mellitus without complications: Secondary | ICD-10-CM | POA: Insufficient documentation

## 2023-07-23 LAB — COMPREHENSIVE METABOLIC PANEL WITH GFR
ALT: 21 U/L (ref 0–44)
AST: 19 U/L (ref 15–41)
Albumin: 3.3 g/dL — ABNORMAL LOW (ref 3.5–5.0)
Alkaline Phosphatase: 58 U/L (ref 38–126)
Anion gap: 11 (ref 5–15)
BUN: 21 mg/dL — ABNORMAL HIGH (ref 6–20)
CO2: 24 mmol/L (ref 22–32)
Calcium: 9.5 mg/dL (ref 8.9–10.3)
Chloride: 103 mmol/L (ref 98–111)
Creatinine, Ser: 1.12 mg/dL — ABNORMAL HIGH (ref 0.44–1.00)
GFR, Estimated: 58 mL/min — ABNORMAL LOW (ref >60)
Glucose, Bld: 149 mg/dL — ABNORMAL HIGH (ref 70–99)
Potassium: 4.4 mmol/L (ref 3.5–5.1)
Sodium: 138 mmol/L (ref 135–145)
Total Bilirubin: 0.7 mg/dL (ref 0.0–1.2)
Total Protein: 7.7 g/dL (ref 6.5–8.1)

## 2023-07-23 LAB — LIPID PANEL
Cholesterol: 137 mg/dL (ref 0–200)
HDL: 44 mg/dL (ref >40)
LDL Cholesterol: 64 mg/dL (ref 0–99)
Total CHOL/HDL Ratio: 3.1 ratio
Triglycerides: 147 mg/dL (ref <150)
VLDL: 29 mg/dL (ref 0–40)

## 2023-07-23 LAB — HEMOGLOBIN A1C
Hgb A1c MFr Bld: 7.7 % — ABNORMAL HIGH (ref 4.8–5.6)
Mean Plasma Glucose: 174.29 mg/dL

## 2023-07-24 ENCOUNTER — Encounter: Payer: Self-pay | Admitting: Physician Assistant

## 2023-07-24 ENCOUNTER — Ambulatory Visit: Payer: Self-pay | Admitting: Physician Assistant

## 2023-07-24 VITALS — BP 120/74 | HR 95 | Temp 97.6°F | Ht 61.75 in | Wt 268.0 lb

## 2023-07-24 DIAGNOSIS — I10 Essential (primary) hypertension: Secondary | ICD-10-CM

## 2023-07-24 DIAGNOSIS — N1831 Chronic kidney disease, stage 3a: Secondary | ICD-10-CM

## 2023-07-24 DIAGNOSIS — E1165 Type 2 diabetes mellitus with hyperglycemia: Secondary | ICD-10-CM

## 2023-07-24 DIAGNOSIS — E785 Hyperlipidemia, unspecified: Secondary | ICD-10-CM

## 2023-07-24 DIAGNOSIS — Z6841 Body Mass Index (BMI) 40.0 and over, adult: Secondary | ICD-10-CM

## 2023-07-24 NOTE — Progress Notes (Signed)
 BP 120/74   Pulse 95   Temp 97.6 F (36.4 C)   Ht 5' 1.75" (1.568 m)   Wt 268 lb (121.6 kg)   SpO2 97%   BMI 49.42 kg/m    Subjective:    Patient ID: Jaclyn Ruiz, female    DOB: [birth year], 53 y.o.   MRN: [id number]  HPI: Jaclyn Ruiz is a 53 y.o. female presenting on 07/24/2023 for Diabetes, Hyperlipidemia, Hypertension, and Chronic Kidney Disease   HPI    Chief Complaint  Patient presents with   Diabetes   Hyperlipidemia   Hypertension   Chronic Kidney Disease     Pt is happy she got her dentures.  She is still working  she has no complaints today.      Relevant past medical, surgical, family and social history reviewed and updated as indicated. Interim medical history since our last visit reviewed. Allergies and medications reviewed and updated.   Current Outpatient Medications:    albuterol (VENTOLIN HFA) 108 (90 Base) MCG/ACT inhaler, Inhale 1-2 puffs into the lungs every 6 (six) hours as needed for wheezing or shortness of breath. INHALE 2 PUFFS BY MOUTH EVERY 6 HOURS AS NEEDED FOR COUGHING, WHEEZING, OR SHORTNESS OF BREATH, Disp: 3 each, Rfl: 1   atorvastatin (LIPITOR) 20 MG tablet, TAKE 1 Tablet BY MOUTH ONCE DAILY, Disp: 90 tablet, Rfl: 1   losartan (COZAAR) 50 MG tablet, Take 1 tablet (50 mg total) by mouth daily., Disp: 90 tablet, Rfl: 0   metFORMIN (GLUCOPHAGE) 1000 MG tablet, Take 1 tablet (1,000 mg total) by mouth 2 (two) times daily with a meal., Disp: 180 tablet, Rfl: 0   sitaGLIPtin (JANUVIA) 100 MG tablet, Take 1 tablet (100 mg total) by mouth daily., Disp: 90 tablet, Rfl: 0    Review of Systems  Per HPI unless specifically indicated above     Objective:    BP 120/74   Pulse 95   Temp 97.6 F (36.4 C)   Ht 5' 1.75" (1.568 m)   Wt 268 lb (121.6 kg)   SpO2 97%   BMI 49.42 kg/m   Wt Readings from Last 3 Encounters:  07/24/23 268 lb (121.6 kg)  11/26/22 268 lb (121.6 kg)  08/15/22 285 lb (129.3 kg)    Physical Exam Vitals  reviewed.  Constitutional:      General: She is not in acute distress.    Appearance: She is obese. She is not toxic-appearing.  HENT:     Head: Normocephalic and atraumatic.  Cardiovascular:     Rate and Rhythm: Normal rate and regular rhythm.  Pulmonary:     Effort: Pulmonary effort is normal.     Breath sounds: Normal breath sounds.  Abdominal:     General: Bowel sounds are normal.     Palpations: Abdomen is soft. There is no mass.     Tenderness: There is no abdominal tenderness.  Musculoskeletal:     Cervical back: Neck supple.     Right lower leg: No edema.     Left lower leg: No edema.  Lymphadenopathy:     Cervical: No cervical adenopathy.  Skin:    General: Skin is warm and dry.  Neurological:     Mental Status: She is alert and oriented to person, place, and time.  Psychiatric:        Behavior: Behavior normal. Behavior is cooperative.        Results for orders placed or performed during the hospital encounter of 07/23/23  Hemoglobin A1c   Collection Time: 07/23/23  8:20 AM  Result Value Ref Range   Hgb A1c MFr Bld 7.7 (H) 4.8 - 5.6 %   Mean Plasma Glucose 174.29 mg/dL  Lipid panel   Collection Time: 07/23/23  8:20 AM  Result Value Ref Range   Cholesterol 137 0 - 200 mg/dL   Triglycerides 562 <130 mg/dL   HDL 44 >86 mg/dL   Total CHOL/HDL Ratio 3.1 RATIO   VLDL 29 0 - 40 mg/dL   LDL Cholesterol 64 0 - 99 mg/dL  Comprehensive metabolic panel   Collection Time: 07/23/23  8:20 AM  Result Value Ref Range   Sodium 138 135 - 145 mmol/L   Potassium 4.4 3.5 - 5.1 mmol/L   Chloride 103 98 - 111 mmol/L   CO2 24 22 - 32 mmol/L   Glucose, Bld 149 (H) 70 - 99 mg/dL   BUN 21 (H) 6 - 20 mg/dL   Creatinine, Ser 5.78 (H) 0.44 - 1.00 mg/dL   Calcium 9.5 8.9 - 46.9 mg/dL   Total Protein 7.7 6.5 - 8.1 g/dL   Albumin 3.3 (L) 3.5 - 5.0 g/dL   AST 19 15 - 41 U/L   ALT 21 0 - 44 U/L   Alkaline Phosphatase 58 38 - 126 U/L   Total Bilirubin 0.7 0.0 - 1.2 mg/dL   GFR,  Estimated 58 (L) >60 mL/min   Anion gap 11 5 - 15        Assessment & Plan:   Encounter Diagnoses  Name Primary?   Uncontrolled type 2 diabetes mellitus with hyperglycemia (HCC) Yes   Primary hypertension    Hyperlipidemia, unspecified hyperlipidemia type    Stage 3a chronic kidney disease (HCC)    BMI 45.0-49.9, adult (HCC)       -Reviewed labs with pt  -will Refer to DM eye doctor fr annual exam -pt Declined mammogram -pt Declined colon cancer screening -discussed uncontrolled DM with pt and she declines to take insulin.  She declines to go to RD; she says she already has DM diet book. -pt encouraged to exercise/walk regularly to help with her A1c -will continue to monitor renal function -pt to follow up 3 months.  She is to contact office sooner prn

## 2023-08-16 ENCOUNTER — Telehealth: Payer: Self-pay

## 2023-08-16 NOTE — Telephone Encounter (Signed)
 Attempted follow up call, no answer left VM requesting return call. Will continue to attempt to reach for any needs of [facility] Client.   Kris Pester RN [employer]

## 2023-08-26 ENCOUNTER — Other Ambulatory Visit: Payer: Self-pay | Admitting: Physician Assistant

## 2023-10-09 ENCOUNTER — Other Ambulatory Visit: Payer: Self-pay | Admitting: Physician Assistant

## 2023-10-09 DIAGNOSIS — E1165 Type 2 diabetes mellitus with hyperglycemia: Secondary | ICD-10-CM

## 2023-10-09 DIAGNOSIS — I10 Essential (primary) hypertension: Secondary | ICD-10-CM

## 2023-10-09 DIAGNOSIS — N1831 Chronic kidney disease, stage 3a: Secondary | ICD-10-CM

## 2023-10-09 DIAGNOSIS — E785 Hyperlipidemia, unspecified: Secondary | ICD-10-CM

## 2023-10-09 DIAGNOSIS — D649 Anemia, unspecified: Secondary | ICD-10-CM

## 2023-10-22 ENCOUNTER — Other Ambulatory Visit (HOSPITAL_COMMUNITY)
Admission: RE | Admit: 2023-10-22 | Discharge: 2023-10-22 | Disposition: A | Discharge status: Home / Self Care | Payer: Self-pay | Source: Ambulatory Visit | Attending: Physician Assistant | Admitting: Physician Assistant

## 2023-10-22 DIAGNOSIS — D649 Anemia, unspecified: Secondary | ICD-10-CM | POA: Insufficient documentation

## 2023-10-22 DIAGNOSIS — E785 Hyperlipidemia, unspecified: Secondary | ICD-10-CM | POA: Insufficient documentation

## 2023-10-22 DIAGNOSIS — N1831 Chronic kidney disease, stage 3a: Secondary | ICD-10-CM | POA: Insufficient documentation

## 2023-10-22 DIAGNOSIS — I10 Essential (primary) hypertension: Secondary | ICD-10-CM | POA: Insufficient documentation

## 2023-10-22 DIAGNOSIS — E1165 Type 2 diabetes mellitus with hyperglycemia: Secondary | ICD-10-CM | POA: Insufficient documentation

## 2023-10-22 LAB — COMPREHENSIVE METABOLIC PANEL WITH GFR
ALT: 22 U/L (ref 0–44)
AST: 20 U/L (ref 15–41)
Albumin: 3.6 g/dL (ref 3.5–5.0)
Alkaline Phosphatase: 61 U/L (ref 38–126)
Anion gap: 9 (ref 5–15)
BUN: 20 mg/dL (ref 6–20)
CO2: 27 mmol/L (ref 22–32)
Calcium: 9.5 mg/dL (ref 8.9–10.3)
Chloride: 101 mmol/L (ref 98–111)
Creatinine, Ser: 1.43 mg/dL — ABNORMAL HIGH (ref 0.44–1.00)
GFR, Estimated: 43 mL/min — ABNORMAL LOW (ref >60)
Glucose, Bld: 131 mg/dL — ABNORMAL HIGH (ref 70–99)
Potassium: 4.3 mmol/L (ref 3.5–5.1)
Sodium: 137 mmol/L (ref 135–145)
Total Bilirubin: 1.1 mg/dL (ref 0.0–1.2)
Total Protein: 8.1 g/dL (ref 6.5–8.1)

## 2023-10-22 LAB — LIPID PANEL
Cholesterol: 129 mg/dL (ref 0–200)
HDL: 45 mg/dL (ref >40)
LDL Cholesterol: 58 mg/dL (ref 0–99)
Total CHOL/HDL Ratio: 2.9 ratio
Triglycerides: 128 mg/dL (ref <150)
VLDL: 26 mg/dL (ref 0–40)

## 2023-10-22 LAB — CBC
HCT: 41 % (ref 36.0–46.0)
Hemoglobin: 11.9 g/dL — ABNORMAL LOW (ref 12.0–15.0)
MCH: 24.9 pg — ABNORMAL LOW (ref 26.0–34.0)
MCHC: 29 g/dL — ABNORMAL LOW (ref 30.0–36.0)
MCV: 86 fL (ref 80.0–100.0)
Platelets: 303 K/uL (ref 150–400)
RBC: 4.77 MIL/uL (ref 3.87–5.11)
RDW: 14.6 % (ref 11.5–15.5)
WBC: 11.1 K/uL — ABNORMAL HIGH (ref 4.0–10.5)
nRBC: 0 % (ref 0.0–0.2)

## 2023-10-22 LAB — HEMOGLOBIN A1C
Hgb A1c MFr Bld: 7.4 % — ABNORMAL HIGH (ref 4.8–5.6)
Mean Plasma Glucose: 165.68 mg/dL

## 2023-10-23 ENCOUNTER — Ambulatory Visit: Payer: Self-pay | Admitting: Physician Assistant

## 2023-10-23 ENCOUNTER — Encounter: Payer: Self-pay | Admitting: Physician Assistant

## 2023-10-23 VITALS — BP 128/70 | HR 82 | Temp 97.8°F | Ht 61.75 in | Wt 264.0 lb

## 2023-10-23 DIAGNOSIS — Z6841 Body Mass Index (BMI) 40.0 and over, adult: Secondary | ICD-10-CM

## 2023-10-23 DIAGNOSIS — E785 Hyperlipidemia, unspecified: Secondary | ICD-10-CM

## 2023-10-23 DIAGNOSIS — I10 Essential (primary) hypertension: Secondary | ICD-10-CM

## 2023-10-23 DIAGNOSIS — N1832 Chronic kidney disease, stage 3b: Secondary | ICD-10-CM

## 2023-10-23 DIAGNOSIS — D649 Anemia, unspecified: Secondary | ICD-10-CM

## 2023-10-23 DIAGNOSIS — E1165 Type 2 diabetes mellitus with hyperglycemia: Secondary | ICD-10-CM

## 2023-10-23 LAB — MICROALBUMIN, URINE: Microalb, Ur: 33.9 ug/mL — ABNORMAL HIGH

## 2023-10-23 NOTE — Progress Notes (Unsigned)
 BP 128/70 (Cuff Size: Large)   Pulse 82   Temp 97.8 F (36.6 C)   Ht 5' 1.75 (1.568 m)   Wt 264 lb (119.7 kg)   SpO2 93%   BMI 48.68 kg/m    Subjective:    Patient ID: Jaclyn Ruiz, female    DOB: 16-Nov-1970, 53 y.o.   MRN: [id number]  HPI: Jaclyn Ruiz is a 53 y.o. female presenting on 10/23/2023 for Diabetes and Hypertension   HPI   Doing well    Relevant past medical, surgical, family and social history reviewed and updated as indicated. Interim medical history since our last visit reviewed. Allergies and medications reviewed and updated.    Current Outpatient Medications:    albuterol  (VENTOLIN  HFA) 108 (90 Base) MCG/ACT inhaler, Inhale 1-2 puffs into the lungs every 6 (six) hours as needed for wheezing or shortness of breath. INHALE 2 PUFFS BY MOUTH EVERY 6 HOURS AS NEEDED FOR COUGHING, WHEEZING, OR SHORTNESS OF BREATH, Disp: 3 each, Rfl: 1   atorvastatin  (LIPITOR) 20 MG tablet, TAKE 1 Tablet BY MOUTH ONCE DAILY, Disp: 90 tablet, Rfl: 1   JANUVIA  100 MG tablet, TAKE 1 Tablet BY MOUTH ONCE DAILY, Disp: 90 tablet, Rfl: 0   losartan  (COZAAR ) 50 MG tablet, TAKE 1 Tablet BY MOUTH ONCE DAILY, Disp: 90 tablet, Rfl: 0   metFORMIN  (GLUCOPHAGE ) 1000 MG tablet, TAKE 1 Tablet  BY MOUTH TWICE DAILY WITH A MEAL, Disp: 180 tablet, Rfl: 0     Review of Systems  Per HPI unless specifically indicated above     Objective:    BP 128/70 (Cuff Size: Large)   Pulse 82   Temp 97.8 F (36.6 C)   Ht 5' 1.75 (1.568 m)   Wt 264 lb (119.7 kg)   SpO2 93%   BMI 48.68 kg/m   Wt Readings from Last 3 Encounters:  10/23/23 264 lb (119.7 kg)  07/24/23 268 lb (121.6 kg)  11/26/22 268 lb (121.6 kg)    Physical Exam  Results for orders placed or performed during the hospital encounter of 10/22/23  Microalbumin, urine   Collection Time: 10/22/23  8:05 AM  Result Value Ref Range   Microalb, Ur 33.9 (H) Not Estab. ug/mL  Hemoglobin A1c   Collection Time: 10/22/23  8:06 AM  Result  Value Ref Range   Hgb A1c MFr Bld 7.4 (H) 4.8 - 5.6 %   Mean Plasma Glucose 165.68 mg/dL  CBC   Collection Time: 10/22/23  8:06 AM  Result Value Ref Range   WBC 11.1 (H) 4.0 - 10.5 K/uL   RBC 4.77 3.87 - 5.11 MIL/uL   Hemoglobin 11.9 (L) 12.0 - 15.0 g/dL   HCT 58.9 63.9 - 53.9 %   MCV 86.0 80.0 - 100.0 fL   MCH 24.9 (L) 26.0 - 34.0 pg   MCHC 29.0 (L) 30.0 - 36.0 g/dL   RDW 85.3 88.4 - 84.4 %   Platelets 303 150 - 400 K/uL   nRBC 0.0 0.0 - 0.2 %  Lipid panel   Collection Time: 10/22/23  8:06 AM  Result Value Ref Range   Cholesterol 129 0 - 200 mg/dL   Triglycerides 871 <849 mg/dL   HDL 45 >59 mg/dL   Total CHOL/HDL Ratio 2.9 RATIO   VLDL 26 0 - 40 mg/dL   LDL Cholesterol 58 0 - 99 mg/dL  Comprehensive metabolic panel with GFR   Collection Time: 10/22/23  8:06 AM  Result Value Ref Range  Sodium 137 135 - 145 mmol/L   Potassium 4.3 3.5 - 5.1 mmol/L   Chloride 101 98 - 111 mmol/L   CO2 27 22 - 32 mmol/L   Glucose, Bld 131 (H) 70 - 99 mg/dL   BUN 20 6 - 20 mg/dL   Creatinine, Ser 8.56 (H) 0.44 - 1.00 mg/dL   Calcium  9.5 8.9 - 10.3 mg/dL   Total Protein 8.1 6.5 - 8.1 g/dL   Albumin 3.6 3.5 - 5.0 g/dL   AST 20 15 - 41 U/L   ALT 22 0 - 44 U/L   Alkaline Phosphatase 61 38 - 126 U/L   Total Bilirubin 1.1 0.0 - 1.2 mg/dL   GFR, Estimated 43 (L) >60 mL/min   Anion gap 9 5 - 15      Assessment & Plan:    Refer to nephrology Has appt for dm eye exam in early august

## 2023-10-23 NOTE — Patient Instructions (Signed)
 Chronic Kidney Disease in Adults: What to Know Chronic kidney disease (CKD) is when lasting damage happens to the kidneys slowly over time. The kidneys are two organs that do many important things in the body. These include: Taking waste and extra fluid out of the blood to make pee (urine). Making hormones. Keeping the right amount of fluids and chemicals in the body. A small amount of kidney damage may not cause problems. You must take steps to help keep the kidney damage from getting worse. A lot of damage may cause kidney failure. Kidney failure means the kidneys can no longer work right. What are the causes? Diabetes. High blood pressure. Diseases that affect the heart and blood vessels. Other kidney diseases. Diseases that affect the body's defense system (immune system). A problem with the flow of pee. This may be caused by: Kidney stones. Cancer. An enlarged prostate, in males. A kidney infection or urinary tract infection (UTI) that keeps coming back. What increases the risk? Getting older. The chances of having CKD increase with age. A family history of kidney disease or kidney failure. Having a disease caused by genes. Taking medicines that can harm the kidneys. Being near or having contact with harmful substances. Being very overweight. Using tobacco now or in the past. What are the signs or symptoms? Common symptoms of CKD include: Feeling very tired and having less energy. Swelling of the face, legs, ankles, or feet. Throwing up or feeling like you may throw up. Not wanting to eat as much as normal. Being confused or not able to focus. Twitches and cramps in the leg muscles or other muscles. Dry, itchy skin. Other symptoms may include: Shortness of breath. Trouble sleeping. Making less pee, or making more pee, especially at night. A taste of metal in your mouth. You may also become anemic. Anemia means there's not enough red blood cells in your blood. You may get  symptoms slowly. You may not notice them until the kidney damage gets very bad. How is this diagnosed? CKD may be diagnosed based on: Tests on your blood or pee. Imaging tests, like an ultrasound or a CT scan. A kidney biopsy. For this test, a sample of kidney tissue is removed to be looked at under a microscope. These tests will help to find out how serious the CKD is. How is this treated? Often, there's no cure for CKD. Treatment can help with symptoms and help keep the disease from getting worse. Treatment may include: Treating other problems that are causing your CKD or making it worse. Diet changes. You may need to: Avoid alcohol. Avoid foods that are high in salt, potassium, phosphorous, and protein. Taking medicines for symptoms and to help control other conditions. Dialysis. This treatment gets harmful waste out of your body. It may be needed if you have kidney failure. Follow these instructions at home: Medicines Take your medicines only as told. The amount of some medicines you take may need to be changed. Do not take any new medicines, vitamins, or supplements unless your health care provider says it's okay. These may make kidney damage worse. Lifestyle Do not smoke, vape, or use nicotine or tobacco. If you drink alcohol: Limit how much you have to: 0-1 drink a day if you're female. 0-2 drinks a day if you're female. Know how much alcohol is in your drink. In the [place] one drink is one 12 oz bottle of beer (355 mL), one 5 oz glass of wine (148 mL), or one 1 oz  glass of hard liquor (44 mL). Stay at a healthy weight. If you need help, ask your provider. General instructions  Eat and drink as told. Track your blood pressure at home. Tell your provider about any changes. If you have diabetes, track your blood sugar as told. Exercise at least 30 minutes a day, 5 days a week. Keep your shots (vaccinations) up to date. Keep all follow-up visits. Your provider may need to change  your treatments over time. Where to find support American Kidney Fund: [place].com.au Kidney School: kidneyschool.org American Association of Kidney Patients: https://www.miller-montoya.com/ Where to find more information National Kidney Foundation: kidney.org Centers for Disease Control and Prevention. To learn more: Go to DiningCalendar.de. Click "Search". Type "chronic kidney disease" in the search box. Contact a health care provider if: You have new symptoms. You get symptoms of end-stage kidney disease. These include: Headaches. Numbness in your hands or feet. Leg cramps. Easy bruising. Get help right away if: You have a fever. You make less pee than usual. You have pain or bleeding when you pee or poop. You have chest pain. You have shortness of breath. These symptoms may be an emergency. Call 911 right away. Do not wait to see if the symptoms will go away. Do not drive yourself to the hospital. This information is not intended to replace advice given to you by your health care provider. Make sure you discuss any questions you have with your health care provider. Document Revised: 02/12/2023 Document Reviewed: 10/05/2022 Elsevier Patient Education  2024 [employer].

## 2023-11-21 ENCOUNTER — Encounter: Payer: Self-pay | Admitting: Physician Assistant

## 2023-12-19 ENCOUNTER — Other Ambulatory Visit (HOSPITAL_COMMUNITY): Payer: Self-pay | Admitting: Nephrology

## 2023-12-19 DIAGNOSIS — R809 Proteinuria, unspecified: Secondary | ICD-10-CM

## 2023-12-19 DIAGNOSIS — N1832 Chronic kidney disease, stage 3b: Secondary | ICD-10-CM

## 2023-12-21 NOTE — ED Triage Notes (Signed)
 Patient complains of back pain that started yesterday. Patient says she has been taking Tylenol  without relief. Patient denies injury or difficulty with urination.

## 2023-12-23 ENCOUNTER — Telehealth: Payer: Self-pay | Admitting: Physician Assistant

## 2023-12-23 NOTE — Telephone Encounter (Signed)
 Pt called stating that she had been to ER over the weekend and was told she had a kidney stone.  She says she is having no problems at work due to the pain but is having problems sleeping at night due to the pain.   Notes in Epic were reviewed- no provider notes were found but CT reviewed.  When pt was asked if she was drinking lots of water, she said she drank one bottle yesterday.  Discussed with pt that she needed to increase that to 6-8 bottles/day to pass the stone.  Pt inquired about pineapple juice that she read on internet would help- pt was told that the pineapple juice wouldn't hurt her stone but that she needed to use caution with fruit juice due to her diabetes.  Discussed with pt that she really needed to push water.  Pt was offered appointment and she said she would schedule to come in on Thursday before her renal US  (scheduled by nephrologist prior to onset kidney stone pain).  Pt was told to contact office if she wanted to be seen sooner.

## 2023-12-26 ENCOUNTER — Ambulatory Visit: Payer: Self-pay | Admitting: Physician Assistant

## 2023-12-26 ENCOUNTER — Encounter: Payer: Self-pay | Admitting: Physician Assistant

## 2023-12-26 ENCOUNTER — Ambulatory Visit (HOSPITAL_COMMUNITY)
Admission: RE | Admit: 2023-12-26 | Discharge: 2023-12-26 | Disposition: A | Discharge status: Home / Self Care | Payer: Self-pay | Source: Ambulatory Visit | Attending: Nephrology | Admitting: Nephrology

## 2023-12-26 VITALS — BP 166/86 | HR 72 | Temp 98.0°F

## 2023-12-26 DIAGNOSIS — R809 Proteinuria, unspecified: Secondary | ICD-10-CM | POA: Insufficient documentation

## 2023-12-26 DIAGNOSIS — N1832 Chronic kidney disease, stage 3b: Secondary | ICD-10-CM | POA: Insufficient documentation

## 2023-12-26 DIAGNOSIS — R109 Unspecified abdominal pain: Secondary | ICD-10-CM

## 2023-12-26 LAB — POCT URINALYSIS DIPSTICK
Bilirubin, UA: NEGATIVE
Blood, UA: NEGATIVE
Glucose, UA: NEGATIVE
Ketones, UA: NEGATIVE
Leukocytes, UA: NEGATIVE
Nitrite, UA: NEGATIVE
Protein, UA: NEGATIVE
Spec Grav, UA: 1.02 (ref 1.010–1.025)
Urobilinogen, UA: 0.2 U/dL
pH, UA: 5.5 (ref 5.0–8.0)

## 2023-12-26 NOTE — Patient Instructions (Addendum)
 Cholelithiasis  Cholelithiasis is a disease in which gallstones form in the gallbladder. The gallbladder is an organ that stores bile. Bile is a fluid that helps to digest fats. Gallstones begin as small crystals and can slowly grow into stones. They may cause no symptoms until they block the gallbladder duct, or cystic duct, when the gallbladder tightens, or contracts, after food is eaten. This can cause pain and is known as a gallbladder attack, or biliary colic. There are two main types of gallstones: Cholesterol stones. These are the most common type of gallstone. These stones are made of hardened cholesterol and are usually yellow-green in color. Pigment stones. These are dark in color and are made of a red-yellow substance, called bilirubin,that forms when hemoglobin from red blood cells breaks down. What are the causes? This condition may be caused by too little or too much of the substances that are in bile. This can happen if the bile: Has too much bilirubin. This can happen in certain blood diseases, such as sickle cell anemia. Has too much cholesterol. Does not have enough bile salts. These salts help the body absorb and digest fats. It can also happen if the gallbladder is not emptying completely. This is common during pregnancy. What increases the risk? The following factors may make you more likely to develop this condition: Being older than 55 years of age. Eating a diet that is heavy in fried foods, fat, and refined carbohydrates, such as white bread and white rice. Being female. Having multiple pregnancies. Using medicines that contain female hormones (estrogen) for a long time. Having certain medical problems, such as: Diabetes mellitus. Obesity. Cystic fibrosis. Crohn's disease. Cirrhosis or other long-term (chronic) liver disease. Certain blood diseases, such as sickle cell anemia or leukemia. Having a family history of gallstones. Losing weight quickly. What are  the signs or symptoms? In many cases, having gallstones causes no symptoms. These are called silent gallstones. If a gallstone blocks your bile duct, it can cause a gallbladder attack. The main symptom of a gallbladder attack is sudden pain in the upper right part of the abdomen. The pain: Usually comes at night or after eating. Can last for one hour or more. Can spread to your right shoulder, back, or chest. Can feel like indigestion. This is discomfort, burning, or fullness in your upper abdomen. If the bile duct is blocked for more than a few hours, it can cause an infection or inflammation of your gallbladder (cholecystitis), liver, or pancreas. This can cause: Nausea or vomiting. Bloating. Pain in your abdomen that lasts for 5 hours or longer. Tenderness in your upper abdomen, often in the upper right section and under your rib cage. Fever or chills. Skin or the white parts of your eyes turning yellow (jaundice). This usually happens when a stone has blocked bile from passing through the bile duct. Dark pee (urine) or light-colored poop (stools). How is this diagnosed? This condition may be diagnosed based on: A physical exam. Your medical history. Ultrasound. CT scan. MRI. You may also have other tests, including: Blood tests to check for infection or inflammation. The HIDA scan to see the gallbladder and the bile ducts. An endoscope to check for blockage in the bile ducts. How is this treated? Treatment depends on the severity of your symptoms. Silent gallstones do not need treatment. You may need treatment if a blockage causes a gallbladder attack or other symptoms. Treatment may include: If symptoms are mild, you may care for yourself at  home. For mild symptoms: Stop eating and drinking for 12-24 hours. You may drink water and clear liquids. This helps to cool down your gallbladder. After 1 or 2 days, eat a diet of simple or clear foods, such as broths and crackers. Take  medicines for pain or nausea. Take antibiotics if you have an infection. If symptoms are severe, you may: Stay in the hospital for pain control or to treat severe infection. Have surgery to remove the gallbladder (cholecystectomy). This is the most common treatment if all other treatments have not worked. Take medicines to break up gallstones. Medicines may be used for up to 6-12 months. Have an procedure to capture and remove gallstones. Follow these instructions at home: Medicines Take over-the-counter and prescription medicines only as told by your health care provider. If you were prescribed antibiotics, take them as told by your provider. Do not stop using the antibiotic even if you start to feel better. Ask your provider if the medicine prescribed to you requires you to avoid driving or using machinery. Eating and drinking Drink enough fluid to keep your pee pale yellow. This is important during a gallbladder attack. Water and clear liquids are preferred. Follow a healthy diet. This includes: Reducing fatty foods, such as fried food and foods high in cholesterol. Reducing refined carbohydrates, such as white bread and white rice. Eating more fiber. Aim for foods such as almonds, fruit, and beans. General instructions Do not use any products that contain nicotine or tobacco. These products include cigarettes, chewing tobacco, and vaping devices, such as e-cigarettes. If you need help quitting, ask your provider. Maintain a healthy weight. Keep all follow-up visits. These may include seeing a specialist or a Careers adviser. Where to find more information [employer] of Diabetes and Digestive and Kidney Diseases: [employer] Contact a health care provider if: You think you have had a gallbladder attack. You have been diagnosed with silent gallstones and you develop indigestion or pain in your abdomen. You have pain from a gallbladder attack that lasts for more than 2 hours. You begin  to have attacks more often. You have nausea. You have dark pee or light-colored poop. Get help right away if: You have pain in your abdomen that lasts for more than 5 hours or is getting worse. You have a fever or chills. You have vomiting that does not go away. You develop jaundice. This information is not intended to replace advice given to you by your health care provider. Make sure you discuss any questions you have with your health care provider. Document Revised: 01/15/2022 Document Reviewed: 01/15/2022 Elsevier Patient Education  2024 [employer] Inc. ----------------------------------------------   Kidney Stones  Kidney stones are solid, rock-like deposits that form inside of the kidneys. The kidneys are a pair of organs that make urine. A kidney stone may form in a kidney and move into other parts of the urinary tract, including the tubes that connect the kidneys to the bladder (ureters), the bladder, and the tube that carries urine out of the body (urethra). As the stone moves through these areas, it can cause intense pain and block the flow of urine. Kidney stones are created when high levels of certain minerals are found in the urine. The stones are usually passed out of the body through urination, but in some cases, medical treatment may be needed to remove them. What are the causes? Kidney stones may be caused by: A condition in which certain glands produce too much parathyroid hormone (primary hyperparathyroidism), which  causes too much calcium  buildup in the blood. A buildup of uric acid crystals in the bladder (hyperuricosuria). Uric acid is a chemical that the body produces when you eat certain foods. It usually leaves the body in the urine. Narrowing (stricture) of one or both of the ureters. A kidney blockage that is present at birth (congenital obstruction). Past surgery on the kidney or the ureters. What increases the risk? The following factors may make you more likely  to develop this condition: Having had a kidney stone in the past. Having a family history of kidney stones. Not drinking enough water. Eating a diet that is high in protein, salt (sodium), or sugar. Being overweight or obese. What are the signs or symptoms? Symptoms of a kidney stone may include: Pain in the side of the abdomen, right below the ribs (flank pain). Pain usually spreads (radiates) to the groin. Needing to urinate often or urgently. Painful urination. Blood in the urine (hematuria). Nausea. Vomiting. Fever and chills. How is this diagnosed? This condition may be diagnosed based on: Your symptoms and medical history. A physical exam. Blood tests. Urine tests. These may be done before and after the stone passes out of your body through urination. Imaging tests, such as a CT scan, abdominal X-ray, or ultrasound. A procedure to examine the inside of the bladder (cystoscopy). How is this treated? Treatment for kidney stones depends on the size, location, and makeup of the stones. Kidney stones will often pass out of the body through urination. You may need to: Increase your fluid intake to help pass the stone. In some cases, you may be given fluids through an IV and may need to be monitored in the hospital. Take medicine for pain. Make changes in your diet to help prevent kidney stones from coming back. Sometimes, procedures are needed to remove a kidney stone. This may involve: A procedure to break up kidney stones using: A focused beam of light (laser therapy). Shock waves (extracorporeal shock wave lithotripsy). Surgery to remove kidney stones. This may be needed if you have severe pain or have stones that block your urinary tract. Follow these instructions at home: Medicines Take over-the-counter and prescription medicines only as told by your health care provider. Ask your health care provider if the medicine prescribed to you requires you to avoid driving or using  heavy machinery. Eating and drinking Drink enough fluid to keep your urine pale yellow. You may be instructed to drink at least 8-10 glasses of water each day. This will help you pass the kidney stone. If directed, change your diet. This may include: Limiting how much sodium you eat. Eating more fruits and vegetables. Limiting how much animal protein you eat. Animal proteins include red meat, poultry, fish, and eggs. Eating a normal amount of calcium  (1,000-1,300 mg per day). Follow instructions from your health care provider about eating or drinking restrictions. General instructions Collect urine samples as told by your health care provider. You may need to collect a urine sample: 24 hours after you pass the stone. 8-12 weeks after you pass the kidney stone, and every 6-12 months after that. Strain your urine every time you urinate, for as long as directed. Use the strainer that your health care provider recommends. Do not throw out the kidney stone after passing it. Keep the stone so it can be tested by your health care provider. Testing the makeup of your kidney stone may help prevent you from getting kidney stones in the future. Keep all  follow-up visits. You may need follow-up X-rays or ultrasounds to make sure that your stone has passed. How is this prevented? To prevent another kidney stone: Drink enough fluid to keep your urine pale yellow. This is the best way to prevent kidney stones. Eat a healthy diet. Follow recommendations from your health care provider about foods to avoid. Recommendations vary depending on the type of kidney stone that you have. You may be instructed to eat a low-protein diet. Maintain a healthy weight. Where to find more information National Kidney Foundation (NKF): www.kidney.org [facility]): www.urologyhealth.org Contact a health care provider if: You have pain that gets worse or does not get better with medicine. Get help right away  if: You have a fever or chills. You develop severe pain. You develop new abdominal pain. You faint. You are unable to urinate. Summary Kidney stones are solid, rock-like deposits that form inside of the kidneys. Kidney stones can cause nausea, vomiting, blood in the urine, abdominal pain, and the urge to urinate often. Treatment for kidney stones depends on the size, location, and makeup of the stones. Kidney stones will often pass out of the body through urination. Kidney stones can be prevented by drinking enough fluids, eating a healthy diet, and maintaining a healthy weight. This information is not intended to replace advice given to you by your health care provider. Make sure you discuss any questions you have with your health care provider. Document Revised: 07/12/2021 Document Reviewed: 07/12/2021 Elsevier Patient Education  2024 [employer].

## 2023-12-26 NOTE — Progress Notes (Signed)
 BP (!) 166/86   Pulse 72   Temp 98 F (36.7 C)   SpO2 96%    Subjective:    Patient ID: Jaclyn Ruiz, female    DOB: 12-26-1970, 53 y.o.   MRN: [id number]  HPI: Jaclyn Ruiz is a 53 y.o. female presenting on 12/26/2023 for No chief complaint on file.   HPI   Pain still there but not as bad.  She is drinking apple cider vinegar.    She is not drinking much water still- just one bottle worth yesterday.   Pain is consistent.  Using apap some for pain.  Hurts more with movement  Heating pad helped a lot  Review of CT done at [facility] shows gallstones and kidney stones.    Relevant past medical, surgical, family and social history reviewed and updated as indicated. Interim medical history since our last visit reviewed. Allergies and medications reviewed and updated.   Current Outpatient Medications:    albuterol  (VENTOLIN  HFA) 108 (90 Base) MCG/ACT inhaler, Inhale 1-2 puffs into the lungs every 6 (six) hours as needed for wheezing or shortness of breath. INHALE 2 PUFFS BY MOUTH EVERY 6 HOURS AS NEEDED FOR COUGHING, WHEEZING, OR SHORTNESS OF BREATH, Disp: 3 each, Rfl: 1   atorvastatin  (LIPITOR) 20 MG tablet, TAKE 1 Tablet BY MOUTH ONCE DAILY, Disp: 90 tablet, Rfl: 1   JANUVIA  100 MG tablet, TAKE 1 Tablet BY MOUTH ONCE DAILY, Disp: 90 tablet, Rfl: 0   losartan  (COZAAR ) 50 MG tablet, TAKE 1 Tablet BY MOUTH ONCE DAILY, Disp: 90 tablet, Rfl: 0   metFORMIN  (GLUCOPHAGE ) 1000 MG tablet, TAKE 1 Tablet  BY MOUTH TWICE DAILY WITH A MEAL, Disp: 180 tablet, Rfl: 0    Review of Systems  Per HPI unless specifically indicated above     Objective:    BP (!) 166/86   Pulse 72   Temp 98 F (36.7 C)   SpO2 96%   Wt Readings from Last 3 Encounters:  10/23/23 264 lb (119.7 kg)  07/24/23 268 lb (121.6 kg)  11/26/22 268 lb (121.6 kg)    Physical Exam Vitals reviewed.  Constitutional:      General: She is not in acute distress.    Appearance: She is well-developed. She is obese. She  is not toxic-appearing.  HENT:     Head: Normocephalic and atraumatic.  Cardiovascular:     Rate and Rhythm: Normal rate and regular rhythm.  Pulmonary:     Effort: Pulmonary effort is normal.     Breath sounds: Normal breath sounds.  Abdominal:     General: Bowel sounds are normal.     Palpations: Abdomen is soft. There is no mass.     Tenderness: There is no abdominal tenderness. There is no guarding or rebound.  Musculoskeletal:     Cervical back: Neck supple.  Lymphadenopathy:     Cervical: No cervical adenopathy.  Skin:    General: Skin is warm and dry.  Neurological:     Mental Status: She is alert and oriented to person, place, and time.  Psychiatric:        Behavior: Behavior normal.      \     Assessment & Plan:    Encounter Diagnosis  Name Primary?   Flank pain Yes     -Discussed with pt gallstones more likely to causing her issues.  Encouraged avoiding greasy fried foods.  Also encouraged increasing fluids/drink 6-8 bottles water/day -pt to RTO if pain worsens,fails  to resolve or for new symptoms

## 2024-01-08 ENCOUNTER — Encounter: Payer: Self-pay | Admitting: Physician Assistant

## 2024-01-08 ENCOUNTER — Ambulatory Visit: Payer: Self-pay | Admitting: Physician Assistant

## 2024-01-08 VITALS — BP 130/88 | HR 76 | Temp 97.2°F | Ht 61.75 in | Wt 260.0 lb

## 2024-01-08 DIAGNOSIS — K029 Dental caries, unspecified: Secondary | ICD-10-CM

## 2024-01-08 DIAGNOSIS — K0889 Other specified disorders of teeth and supporting structures: Secondary | ICD-10-CM

## 2024-01-08 MED ORDER — AMOXICILLIN 500 MG PO CAPS: 500.0000 mg | ORAL_CAPSULE | Freq: Three times a day (TID) | ORAL | 0 refills | Status: AC

## 2024-01-08 NOTE — Progress Notes (Signed)
   BP 130/88   Pulse 76   Temp (!) 97.2 F (36.2 C)   Ht 5' 1.75 (1.568 m)   Wt 260 lb (117.9 kg)   SpO2 94%   BMI 47.94 kg/m    Subjective:    Patient ID: Jaclyn Ruiz, female    DOB: [birth year], 52 y.o.   MRN: [id number]  HPI: Jaclyn Ruiz is a 53 y.o. female presenting on 01/08/2024 for Dental Pain   HPI   Tooth pain Started Friday.  It's one of her few remaining teeth. No fever.  She reports face swelling.     Relevant past medical, surgical, family and social history reviewed and updated as indicated. Interim medical history since our last visit reviewed. Allergies and medications reviewed and updated.  Review of Systems  Per HPI unless specifically indicated above     Objective:    BP 130/88   Pulse 76   Temp (!) 97.2 F (36.2 C)   Ht 5' 1.75 (1.568 m)   Wt 260 lb (117.9 kg)   SpO2 94%   BMI 47.94 kg/m   Wt Readings from Last 3 Encounters:  01/08/24 260 lb (117.9 kg)  10/23/23 264 lb (119.7 kg)  07/24/23 268 lb (121.6 kg)    Physical Exam Constitutional:      General: She is not in acute distress.    Appearance: She is not toxic-appearing.  HENT:     Head: Normocephalic and atraumatic.     Comments: No swelling of the jaw or face noted.  No trismus.  No drooling.     Mouth/Throat:     Mouth: Mucous membranes are moist.     Dentition: Abnormal dentition. Dental tenderness and dental caries present. No dental abscesses.   Pulmonary:     Effort: Pulmonary effort is normal. No respiratory distress.  Skin:    General: Skin is warm and dry.  Neurological:     Mental Status: She is alert and oriented to person, place, and time.  Psychiatric:        Behavior: Behavior normal.           Assessment & Plan:    Encounter Diagnoses  Name Primary?   Dentalgia Yes   Dental decay     -rx amoxil  -referred to dentist on urgent list

## 2024-01-24 NOTE — Congregational Nurse Program (Signed)
 Attempted wellness call to [facility] client by Caldwell ROCKFORD MSW [facility] Intern.  No answer and unable to leave a message as VM is full.   [facility] Last seen 01/08/24 Next appointment 02/26/24 at 2:30 PM   Avelina JONELLE Skeen RN [facility]

## 2024-01-31 NOTE — Congregational Nurse Program (Signed)
 [facility] Social Work Tax inspector attempted wellness call for [employer] client. No answer, and unable to leave a message.   Avelina JONELLE Skeen RN [facility] [employer]

## 2024-02-11 ENCOUNTER — Other Ambulatory Visit: Payer: Self-pay | Admitting: Physician Assistant

## 2024-02-11 DIAGNOSIS — I10 Essential (primary) hypertension: Secondary | ICD-10-CM

## 2024-02-11 DIAGNOSIS — E785 Hyperlipidemia, unspecified: Secondary | ICD-10-CM

## 2024-02-11 DIAGNOSIS — N189 Chronic kidney disease, unspecified: Secondary | ICD-10-CM

## 2024-02-11 DIAGNOSIS — E1165 Type 2 diabetes mellitus with hyperglycemia: Secondary | ICD-10-CM

## 2024-02-21 ENCOUNTER — Other Ambulatory Visit: Payer: Self-pay | Admitting: Physician Assistant

## 2024-02-25 ENCOUNTER — Other Ambulatory Visit (HOSPITAL_COMMUNITY)
Admission: RE | Admit: 2024-02-25 | Discharge: 2024-02-25 | Disposition: A | Discharge status: Home / Self Care | Payer: Self-pay | Source: Ambulatory Visit | Attending: Internal Medicine | Admitting: Internal Medicine

## 2024-02-25 DIAGNOSIS — N189 Chronic kidney disease, unspecified: Secondary | ICD-10-CM | POA: Insufficient documentation

## 2024-02-25 DIAGNOSIS — I10 Essential (primary) hypertension: Secondary | ICD-10-CM | POA: Insufficient documentation

## 2024-02-25 DIAGNOSIS — E785 Hyperlipidemia, unspecified: Secondary | ICD-10-CM | POA: Insufficient documentation

## 2024-02-25 DIAGNOSIS — E1165 Type 2 diabetes mellitus with hyperglycemia: Secondary | ICD-10-CM | POA: Insufficient documentation

## 2024-02-25 LAB — LIPID PANEL
Cholesterol: 135 mg/dL (ref 0–200)
HDL: 42 mg/dL (ref >40)
LDL Cholesterol: 66 mg/dL (ref 0–99)
Total CHOL/HDL Ratio: 3.2 ratio
Triglycerides: 136 mg/dL (ref <150)
VLDL: 27 mg/dL (ref 0–40)

## 2024-02-25 LAB — COMPREHENSIVE METABOLIC PANEL WITH GFR
ALT: 29 U/L (ref 0–44)
AST: 29 U/L (ref 15–41)
Albumin: 4 g/dL (ref 3.5–5.0)
Alkaline Phosphatase: 68 U/L (ref 38–126)
Anion gap: 9 (ref 5–15)
BUN: 19 mg/dL (ref 6–20)
CO2: 30 mmol/L (ref 22–32)
Calcium: 9.6 mg/dL (ref 8.9–10.3)
Chloride: 101 mmol/L (ref 98–111)
Creatinine, Ser: 1.07 mg/dL — ABNORMAL HIGH (ref 0.44–1.00)
GFR, Estimated: >60 mL/min (ref >60)
Glucose, Bld: 130 mg/dL — ABNORMAL HIGH (ref 70–99)
Potassium: 4.6 mmol/L (ref 3.5–5.1)
Sodium: 139 mmol/L (ref 135–145)
Total Bilirubin: 0.6 mg/dL (ref 0.0–1.2)
Total Protein: 7.7 g/dL (ref 6.5–8.1)

## 2024-02-25 LAB — HEMOGLOBIN A1C
Hgb A1c MFr Bld: 7 % — ABNORMAL HIGH (ref 4.8–5.6)
Mean Plasma Glucose: 154.2 mg/dL

## 2024-02-26 ENCOUNTER — Encounter: Payer: Self-pay | Admitting: Physician Assistant

## 2024-02-26 ENCOUNTER — Ambulatory Visit: Payer: Self-pay | Admitting: Physician Assistant

## 2024-02-26 VITALS — BP 156/80 | HR 80 | Temp 97.3°F | Resp 97 | Ht 61.75 in | Wt 260.0 lb

## 2024-02-26 DIAGNOSIS — I10 Essential (primary) hypertension: Secondary | ICD-10-CM

## 2024-02-26 DIAGNOSIS — N189 Chronic kidney disease, unspecified: Secondary | ICD-10-CM

## 2024-02-26 DIAGNOSIS — E119 Type 2 diabetes mellitus without complications: Secondary | ICD-10-CM

## 2024-02-26 DIAGNOSIS — E785 Hyperlipidemia, unspecified: Secondary | ICD-10-CM

## 2024-02-26 MED ORDER — AMLODIPINE BESYLATE 5 MG PO TABS: 5.0000 mg | ORAL_TABLET | Freq: Every day | ORAL | 1 refills | Status: DC

## 2024-02-26 MED ORDER — AMLODIPINE BESYLATE 5 MG PO TABS: 5.0000 mg | ORAL_TABLET | Freq: Every day | ORAL | 1 refills | Status: AC

## 2024-02-26 NOTE — Progress Notes (Signed)
 BP (!) 164/76   Pulse 80   Temp (!) 97.3 F (36.3 C)   Resp (!) 97   Ht 5' 1.75 (1.568 m)   Wt 260 lb (117.9 kg)   BMI 47.94 kg/m    Subjective:    Patient ID: Jaclyn Ruiz, female    DOB: 1970/05/18, 53 y.o.   MRN: [id number]  HPI: Jaclyn Ruiz is a 53 y.o. female presenting on 02/26/2024 for Hypertension and Diabetes   HPI  Chief Complaint  Patient presents with   Hypertension   Diabetes    She says she is doing well.  She is still working two jobs.  She has no complaints today  Relevant past medical, surgical, family and social history reviewed and updated as indicated. Interim medical history since our last visit reviewed. Allergies and medications reviewed and updated.    Current Outpatient Medications:    albuterol  (VENTOLIN  HFA) 108 (90 Base) MCG/ACT inhaler, Inhale 1-2 puffs into the lungs every 6 (six) hours as needed for wheezing or shortness of breath. INHALE 2 PUFFS BY MOUTH EVERY 6 HOURS AS NEEDED FOR COUGHING, WHEEZING, OR SHORTNESS OF BREATH, Disp: 3 each, Rfl: 1   APPLE CIDER VINEGAR PO, Take 1 tablet by mouth daily., Disp: , Rfl:    atorvastatin  (LIPITOR) 20 MG tablet, TAKE 1 Tablet BY MOUTH ONCE DAILY, Disp: 90 tablet, Rfl: 0   JANUVIA  100 MG tablet, TAKE 1 Tablet BY MOUTH ONCE DAILY, Disp: 90 tablet, Rfl: 0   losartan  (COZAAR ) 50 MG tablet, TAKE 1 Tablet BY MOUTH ONCE DAILY, Disp: 90 tablet, Rfl: 0   metFORMIN  (GLUCOPHAGE ) 1000 MG tablet, TAKE 1 Tablet  BY MOUTH TWICE DAILY WITH A MEAL, Disp: 180 tablet, Rfl: 0   Multiple Vitamins-Minerals (HAIR SKIN AND NAILS FORMULA PO), Take 1 tablet by mouth daily., Disp: , Rfl:    Review of Systems  Per HPI unless specifically indicated above     Objective:    BP (!) 164/76   Pulse 80   Temp (!) 97.3 F (36.3 C)   Resp (!) 97   Ht 5' 1.75 (1.568 m)   Wt 260 lb (117.9 kg)   BMI 47.94 kg/m   Wt Readings from Last 3 Encounters:  02/26/24 260 lb (117.9 kg)  01/08/24 260 lb (117.9 kg)  10/23/23  264 lb (119.7 kg)    156/80  167/51  Physical Exam Vitals reviewed.  Constitutional:      General: She is not in acute distress.    Appearance: She is well-developed. She is not toxic-appearing.  HENT:     Head: Normocephalic and atraumatic.  Cardiovascular:     Rate and Rhythm: Normal rate and regular rhythm.  Pulmonary:     Effort: Pulmonary effort is normal.     Breath sounds: Normal breath sounds.  Abdominal:     General: Bowel sounds are normal.     Palpations: Abdomen is soft. There is no mass.     Tenderness: There is no abdominal tenderness.  Musculoskeletal:     Cervical back: Neck supple.     Right lower leg: No edema.     Left lower leg: No edema.  Lymphadenopathy:     Cervical: No cervical adenopathy.  Skin:    General: Skin is warm and dry.  Neurological:     Mental Status: She is alert and oriented to person, place, and time.  Psychiatric:        Behavior: Behavior normal.  Results for orders placed or performed during the hospital encounter of 02/25/24  Hemoglobin A1c   Collection Time: 02/25/24  8:24 AM  Result Value Ref Range   Hgb A1c MFr Bld 7.0 (H) 4.8 - 5.6 %   Mean Plasma Glucose 154.2 mg/dL  Lipid panel   Collection Time: 02/25/24  8:25 AM  Result Value Ref Range   Cholesterol 135 0 - 200 mg/dL   Triglycerides 863 <849 mg/dL   HDL 42 >59 mg/dL   Total CHOL/HDL Ratio 3.2 RATIO   VLDL 27 0 - 40 mg/dL   LDL Cholesterol 66 0 - 99 mg/dL  Comprehensive metabolic panel with GFR   Collection Time: 02/25/24  8:25 AM  Result Value Ref Range   Sodium 139 135 - 145 mmol/L   Potassium 4.6 3.5 - 5.1 mmol/L   Chloride 101 98 - 111 mmol/L   CO2 30 22 - 32 mmol/L   Glucose, Bld 130 (H) 70 - 99 mg/dL   BUN 19 6 - 20 mg/dL   Creatinine, Ser 8.92 (H) 0.44 - 1.00 mg/dL   Calcium  9.6 8.9 - 10.3 mg/dL   Total Protein 7.7 6.5 - 8.1 g/dL   Albumin 4.0 3.5 - 5.0 g/dL   AST 29 15 - 41 U/L   ALT 29 0 - 44 U/L   Alkaline Phosphatase 68 38 - 126 U/L    Total Bilirubin 0.6 0.0 - 1.2 mg/dL   GFR, Estimated >39 >39 mL/min   Anion gap 9 5 - 15      Assessment & Plan:     Encounter Diagnoses  Name Primary?   Primary hypertension Yes   Hyperlipidemia, unspecified hyperlipidemia type    Controlled type 2 diabetes mellitus without complication, without long-term current use of insulin (HCC)    Chronic kidney disease, unspecified CKD stage   \   -reviewed labs with pt -Pt declined Mammogram and flu shot -DM foot exam was updated -add amlodipine for bp -continue with losartan , metformin , januvia  and atorvastatin  -follow up one month to recheck bp.  Pt to contact office sooner prn

## 2024-03-24 ENCOUNTER — Ambulatory Visit: Payer: Self-pay | Admitting: Physician Assistant

## 2024-03-24 ENCOUNTER — Encounter: Payer: Self-pay | Admitting: Physician Assistant

## 2024-03-24 VITALS — BP 138/76 | HR 81 | Temp 96.8°F | Ht 61.75 in | Wt 263.0 lb

## 2024-03-24 DIAGNOSIS — I10 Essential (primary) hypertension: Secondary | ICD-10-CM

## 2024-03-24 NOTE — Patient Instructions (Signed)
 Muscle Cramps and Spasms Muscle cramps and spasms occur when a muscle or muscles tighten and you have no control over this tightening (involuntary muscle contraction). They are a common problem that can happen in any muscle. The most common place is in the calf muscles of the leg. There are a few ways that muscle cramps and spasms differ: Muscle cramps are painful. They come and go and may last for a few seconds or up to 15 minutes. Muscle cramps are often more forceful and last longer than muscle spasms. Muscle spasms may or may not be painful. They may last just a few seconds or last much longer. Certain conditions, such as diabetes or Parkinson's disease, can make you more likely to have cramps or spasms. But in most cases, cramps and spasms are not caused by other conditions. Common causes include: Overexertion. This is when you do more physical work or exercise than your body is ready for. Overuse from doing the same movements too many times. Staying in one position for too long. Improper preparation, form, or technique when playing a sport or doing an activity. Not enough water or other fluids in your body (dehydration). Other causes may include: Injury. Side effects of some medicines. Too few salts and minerals in your body (electrolytes), such as potassium and calcium . This could happen if you are taking water pills (diuretics) or if you are pregnant. In many cases, the cause of muscle cramps or spasms is not known. Follow these instructions at home: Eating and drinking Drink enough fluid to keep your pee (urine) pale yellow. This can help prevent cramps or spasms. Eat a healthy diet that includes a lot of nutrients to help your muscles work. A healthy diet includes fruits and vegetables, lean protein, whole grains, and low-fat or nonfat dairy products. Managing pain and stiffness     Try to massage, stretch, and relax the affected muscle. Do this for a few minutes at a time. If told,  put ice on the muscles. This may help if you are sore or have pain after a cramp or spasm. Put ice in a plastic bag. Place a towel between your skin and the bag. Leave the ice on for 20 minutes, 2-3 times a day. If told, apply heat to tight or tense muscles as often as told by your health care provider. Use the heat source that your provider recommends, such as a moist heat pack or a heating pad. Place a towel between your skin and the heat source. Leave the heat on for 20-30 minutes. If your skin turns bright red, remove the ice or heat right away to prevent skin damage. The risk of damage is higher if you cannot feel pain, heat, or cold. Take hot showers or baths to help relax tight muscles. General instructions If you are having cramps often, avoid intense exercise for a few days. Take over-the-counter and prescription medicines only as told by your provider. Watch for any changes in your symptoms. Contact a health care provider if: Your cramps or spasms get more severe or happen more often. Your cramps or spasms do not get better over time. This information is not intended to replace advice given to you by your health care provider. Make sure you discuss any questions you have with your health care provider. Document Revised: 11/21/2021 Document Reviewed: 11/21/2021 Elsevier Patient Education  2024 [employer].

## 2024-03-24 NOTE — Progress Notes (Signed)
 BP 138/76   Pulse 81   Temp (!) 96.8 F (36 C)   Ht 5' 1.75 (1.568 m)   Wt 263 lb (119.3 kg)   SpO2 95%   BMI 48.49 kg/m    Subjective:    Patient ID: Jaclyn Ruiz, female    DOB: Aug 02, 1970, 53 y.o.   MRN: [id number]  HPI: Jaclyn Ruiz is a 53 y.o. female presenting on 03/24/2024 for Hypertension   HPI  Chief Complaint  Patient presents with   Hypertension    Amlodipine  was added to her meds at [facility] one month ago.  She is doing well with the new medication.  She has no complaints today.  She says she has follow up with nephrologist this Friday.    Relevant past medical, surgical, family and social history reviewed and updated as indicated. Interim medical history since our last visit reviewed. Allergies and medications reviewed and updated.    Current Outpatient Medications:    albuterol  (VENTOLIN  HFA) 108 (90 Base) MCG/ACT inhaler, Inhale 1-2 puffs into the lungs every 6 (six) hours as needed for wheezing or shortness of breath. INHALE 2 PUFFS BY MOUTH EVERY 6 HOURS AS NEEDED FOR COUGHING, WHEEZING, OR SHORTNESS OF BREATH, Disp: 3 each, Rfl: 1   amLODipine  (NORVASC ) 5 MG tablet, Take 1 tablet (5 mg total) by mouth daily., Disp: 90 tablet, Rfl: 1   APPLE CIDER VINEGAR PO, Take 1 tablet by mouth daily. (Patient taking differently: Take 1 tablet by mouth in the morning, at noon, and at bedtime.), Disp: , Rfl:    atorvastatin  (LIPITOR) 20 MG tablet, TAKE 1 Tablet BY MOUTH ONCE DAILY, Disp: 90 tablet, Rfl: 0   JANUVIA  100 MG tablet, TAKE 1 Tablet BY MOUTH ONCE DAILY, Disp: 90 tablet, Rfl: 0   losartan  (COZAAR ) 50 MG tablet, TAKE 1 Tablet BY MOUTH ONCE DAILY, Disp: 90 tablet, Rfl: 0   metFORMIN  (GLUCOPHAGE ) 1000 MG tablet, TAKE 1 Tablet  BY MOUTH TWICE DAILY WITH A MEAL, Disp: 180 tablet, Rfl: 0   Multiple Vitamins-Minerals (HAIR SKIN AND NAILS FORMULA PO), Take 1 tablet by mouth daily., Disp: , Rfl:     Review of Systems  Per HPI unless specifically indicated above      Objective:    BP 138/76   Pulse 81   Temp (!) 96.8 F (36 C)   Ht 5' 1.75 (1.568 m)   Wt 263 lb (119.3 kg)   SpO2 95%   BMI 48.49 kg/m   Wt Readings from Last 3 Encounters:  03/24/24 263 lb (119.3 kg)  02/26/24 260 lb (117.9 kg)  01/08/24 260 lb (117.9 kg)    Physical Exam Constitutional:      General: She is not in acute distress.    Appearance: She is not toxic-appearing.  HENT:     Head: Normocephalic and atraumatic.  Cardiovascular:     Rate and Rhythm: Normal rate and regular rhythm.  Pulmonary:     Effort: Pulmonary effort is normal. No respiratory distress.     Breath sounds: Normal breath sounds. No wheezing or rhonchi.  Musculoskeletal:     Right lower leg: No edema.     Left lower leg: No edema.  Skin:    General: Skin is warm and dry.  Neurological:     Mental Status: She is alert and oriented to person, place, and time.  Psychiatric:        Behavior: Behavior normal.  Assessment & Plan:    Encounter Diagnosis  Name Primary?   Primary hypertension Yes      Pt to continue current medications.  She will follow up here in two months.  She is to contact office sooner prn

## 2024-04-22 ENCOUNTER — Encounter: Payer: Self-pay | Admitting: Physician Assistant

## 2024-04-22 ENCOUNTER — Ambulatory Visit: Payer: Self-pay | Admitting: Physician Assistant

## 2024-04-22 DIAGNOSIS — J111 Influenza due to unidentified influenza virus with other respiratory manifestations: Secondary | ICD-10-CM

## 2024-04-22 MED ORDER — OSELTAMIVIR PHOSPHATE 75 MG PO CAPS: 75.0000 mg | ORAL_CAPSULE | Freq: Two times a day (BID) | ORAL | 0 refills | Status: AC

## 2024-04-22 NOTE — Progress Notes (Signed)
" ° °  There were no vitals taken for this visit.   Subjective:    Patient ID: Jaclyn Ruiz, female    DOB: [birth year], 54 y.o.   MRN: [id number]  HPI: Jaclyn Ruiz is a 54 y.o. female presenting on 04/22/2024 for No chief complaint on file.   HPI   This is a telemedicine appointment through [employer].  I connected with  Jaclyn Ruiz on 04/22/2024 by a video enabled telemedicine application and verified that I am speaking with the correct person using two identifiers.   I discussed the limitations of evaluation and management by telemedicine. The patient expressed understanding and agreed to proceed.\   Pt is at home.  Provider is at office.   Pt with onset ST yesterday.  She has HA, rhinorrhea and cough.  She denies  fever and doesn't have body aches.  She says her daughter was seen at medical facility and was diagnosed with flu.  Pt does not get vaccinated for flu.  She has been using APAP.  She has not been using her inhaler.     Relevant past medical, surgical, family and social history reviewed and updated as indicated. Interim medical history since our last visit reviewed. Allergies and medications reviewed and updated.  Review of Systems  Per HPI unless specifically indicated above     Objective:    There were no vitals taken for this visit.  Wt Readings from Last 3 Encounters:  03/24/24 263 lb (119.3 kg)  02/26/24 260 lb (117.9 kg)  01/08/24 260 lb (117.9 kg)    Physical Exam Constitutional:      General: She is not in acute distress.    Appearance: She is ill-appearing. She is not toxic-appearing.  HENT:     Head: Normocephalic and atraumatic.     Nose: Congestion and rhinorrhea present.  Pulmonary:     Effort: Pulmonary effort is normal. No respiratory distress.     Comments: Pt is talking in complete sentences without dyspnea Neurological:     Mental Status: She is oriented to person, place, and time.  Psychiatric:        Behavior: Behavior normal.            Assessment & Plan:    Encounter Diagnosis  Name Primary?   Influenza Yes      -rx tamiflu  -discussed anti-tussives and she has some phenergan syrup.  She was reminded about sedation with that medication -pt encouraged to use her inhaler for next several days and then as needed. -pt aware to go to ER for sob, cp -pt to contact office for new symptoms or if persists -note to be out of work is sent to her email-  [email or web address] -she has routine follow up appointment next month   "

## 2024-05-14 ENCOUNTER — Other Ambulatory Visit: Payer: Self-pay | Admitting: Physician Assistant

## 2024-05-14 DIAGNOSIS — E119 Type 2 diabetes mellitus without complications: Secondary | ICD-10-CM

## 2024-05-14 DIAGNOSIS — E785 Hyperlipidemia, unspecified: Secondary | ICD-10-CM

## 2024-05-14 DIAGNOSIS — N189 Chronic kidney disease, unspecified: Secondary | ICD-10-CM

## 2024-05-14 DIAGNOSIS — I10 Essential (primary) hypertension: Secondary | ICD-10-CM

## 2024-05-27 ENCOUNTER — Ambulatory Visit: Payer: Self-pay | Admitting: Physician Assistant
